# Patient Record
Sex: Female | Born: 1955 | Race: White | Hispanic: No | Marital: Single
Health system: Southern US, Community
[De-identification: ages and names within clinical notes are randomized; demographics above are authoritative.]

## PROBLEM LIST (undated history)

## (undated) DIAGNOSIS — G9340 Encephalopathy, unspecified: Secondary | ICD-10-CM

## (undated) DIAGNOSIS — I480 Paroxysmal atrial fibrillation: Secondary | ICD-10-CM

## (undated) DIAGNOSIS — E039 Hypothyroidism, unspecified: Secondary | ICD-10-CM

## (undated) DIAGNOSIS — J449 Chronic obstructive pulmonary disease, unspecified: Secondary | ICD-10-CM

## (undated) DIAGNOSIS — F329 Major depressive disorder, single episode, unspecified: Secondary | ICD-10-CM

## (undated) DIAGNOSIS — I10 Essential (primary) hypertension: Secondary | ICD-10-CM

## (undated) DIAGNOSIS — F32A Depression, unspecified: Secondary | ICD-10-CM

## (undated) DIAGNOSIS — N179 Acute kidney failure, unspecified: Secondary | ICD-10-CM

## (undated) DIAGNOSIS — I2510 Atherosclerotic heart disease of native coronary artery without angina pectoris: Secondary | ICD-10-CM

---

## 2015-06-19 ENCOUNTER — Inpatient Hospital Stay
Admission: AD | Admit: 2015-06-19 | Discharge: 2015-07-25 | Disposition: A | Discharge status: Skilled Nursing Facility | Payer: Self-pay | Source: Ambulatory Visit | Attending: Internal Medicine | Admitting: Internal Medicine

## 2015-06-19 ENCOUNTER — Institutional Professional Consult (permissible substitution) (HOSPITAL_COMMUNITY): Payer: Self-pay

## 2015-06-19 DIAGNOSIS — R110 Nausea: Secondary | ICD-10-CM

## 2015-06-19 DIAGNOSIS — J449 Chronic obstructive pulmonary disease, unspecified: Secondary | ICD-10-CM

## 2015-06-19 DIAGNOSIS — Z931 Gastrostomy status: Secondary | ICD-10-CM

## 2015-06-19 DIAGNOSIS — Z930 Tracheostomy status: Secondary | ICD-10-CM

## 2015-06-19 DIAGNOSIS — J189 Pneumonia, unspecified organism: Secondary | ICD-10-CM

## 2015-06-19 DIAGNOSIS — J9690 Respiratory failure, unspecified, unspecified whether with hypoxia or hypercapnia: Secondary | ICD-10-CM

## 2015-06-19 DIAGNOSIS — J9621 Acute and chronic respiratory failure with hypoxia: Secondary | ICD-10-CM

## 2015-06-19 DIAGNOSIS — Z452 Encounter for adjustment and management of vascular access device: Secondary | ICD-10-CM

## 2015-06-19 HISTORY — DX: Paroxysmal atrial fibrillation: I48.0

## 2015-06-19 HISTORY — DX: Depression, unspecified: F32.A

## 2015-06-19 HISTORY — DX: Atherosclerotic heart disease of native coronary artery without angina pectoris: I25.10

## 2015-06-19 HISTORY — DX: Encephalopathy, unspecified: G93.40

## 2015-06-19 HISTORY — DX: Hypothyroidism, unspecified: E03.9

## 2015-06-19 HISTORY — DX: Chronic obstructive pulmonary disease, unspecified: J44.9

## 2015-06-19 HISTORY — DX: Major depressive disorder, single episode, unspecified: F32.9

## 2015-06-19 HISTORY — DX: Acute kidney failure, unspecified: N17.9

## 2015-06-19 HISTORY — DX: Essential (primary) hypertension: I10

## 2015-06-20 ENCOUNTER — Encounter: Payer: Self-pay | Admitting: Adult Health

## 2015-06-20 ENCOUNTER — Other Ambulatory Visit (HOSPITAL_COMMUNITY): Payer: Self-pay

## 2015-06-20 DIAGNOSIS — J9611 Chronic respiratory failure with hypoxia: Secondary | ICD-10-CM

## 2015-06-20 DIAGNOSIS — J441 Chronic obstructive pulmonary disease with (acute) exacerbation: Secondary | ICD-10-CM

## 2015-06-20 DIAGNOSIS — Z930 Tracheostomy status: Secondary | ICD-10-CM

## 2015-06-20 DIAGNOSIS — J189 Pneumonia, unspecified organism: Secondary | ICD-10-CM

## 2015-06-20 LAB — COMPREHENSIVE METABOLIC PANEL
ALBUMIN: 2 g/dL — AB (ref 3.5–5.0)
ALT: 52 U/L (ref 14–54)
AST: 61 U/L — AB (ref 15–41)
Alkaline Phosphatase: 112 U/L (ref 38–126)
Anion gap: 9 (ref 5–15)
BUN: 70 mg/dL — AB (ref 6–20)
CHLORIDE: 97 mmol/L — AB (ref 101–111)
CO2: 28 mmol/L (ref 22–32)
CREATININE: 1.13 mg/dL — AB (ref 0.44–1.00)
Calcium: 8.3 mg/dL — ABNORMAL LOW (ref 8.9–10.3)
GFR calc Af Amer: >60 mL/min (ref >60)
GFR, EST NON AFRICAN AMERICAN: 52 mL/min — AB (ref >60)
GLUCOSE: 169 mg/dL — AB (ref 65–99)
POTASSIUM: 3.6 mmol/L (ref 3.5–5.1)
Sodium: 134 mmol/L — ABNORMAL LOW (ref 135–145)
Total Bilirubin: 0.8 mg/dL (ref 0.3–1.2)
Total Protein: 5.9 g/dL — ABNORMAL LOW (ref 6.5–8.1)

## 2015-06-20 LAB — CBC
HCT: 26.4 % — ABNORMAL LOW (ref 36.0–46.0)
Hemoglobin: 8 g/dL — ABNORMAL LOW (ref 12.0–15.0)
MCH: 28.3 pg (ref 26.0–34.0)
MCHC: 30.3 g/dL (ref 30.0–36.0)
MCV: 93.3 fL (ref 78.0–100.0)
PLATELETS: 213 10*3/uL (ref 150–400)
RBC: 2.83 MIL/uL — ABNORMAL LOW (ref 3.87–5.11)
RDW: 16 % — AB (ref 11.5–15.5)
WBC: 7.1 10*3/uL (ref 4.0–10.5)

## 2015-06-20 LAB — TSH: TSH: 2.873 u[IU]/mL (ref 0.350–4.500)

## 2015-06-20 NOTE — Consult Note (Signed)
[facility] CONSULT NOTE    Date: 06/20/2015                  Patient Name:  [name] Fowler  MRN: [id number]  DOB: 04/05/1954  Age / Sex: 59 y.o., female         PCP: No primary care provider on file.                 Service Requesting Consult: Dr. Sharyon MedicusHijazi                 Reason for Consult: Management of acute renal failure            History of Present Illness: Patient is a 59 y.o. female with a PMHx of COPD, hypertension, depression, hypothyroidism, paroxysmal atrial fibrillation, coronary artery disease, and recent acute renal failure, who was admitted to [facility] on 06/19/2015 for ongoing treatment of respiratory failure. She was originally admitted to an outside hospital with acute respiratory failure secondary to sepsis from right-sided pneumonia and acute exacerbation of COPD. She also developed atrial fibrillation with rapid ventricular response. She also had acute kidney injury requiring dialysis. She has a temporary doses catheter noted to be in place. She was transferred here on November 8 16 for further weaning efforts. She has a tracheostomy in place. She also had a PEG tube placed.   Medications:  Current medications: Allopurinol 200 mg daily, alprazolam 0.5 mg twice a day aspirin 81 mg daily, Lipitor 20 mg each bedtime, bupropion 75 mg twice a day, Plavix 75 mg daily, Pepcid 20 mg daily, ferrous sulfate 324 mg daily, fluconazole 100 mg daily, fluoxetine 40 mg daily, furosemide 40 mg twice a day, Lantus 18 units subcutaneous twice a day, Synthroid 50 mg daily, metoprolol 25 mg by mouth every 8 hours, Prostat 30 cc by mouth 3 times a day, vitamin B12 1000 mcg daily    Allergies: NSAIDs and Sulfa   Past Medical History: Past Medical History  Diagnosis Date  . COPD, severe (HCC)   . Hypertension   . Depression   . Hypothyroid   . Acute renal failure (ARF) (HCC)   . PAF (paroxysmal atrial fibrillation) (HCC)   . CAD (coronary artery  disease)   . Encephalopathy      Past Surgical History: S/p tracheostomy and PEG placement  Family History: Unable to obtain from the patient as she is currently on the ventilator  Social History: Unable to obtain from the patient as she is currently on a ventilator  Review of Systems: Unable to obtain from the patient as she is currently on the ventilator  Vital Signs: Temperature 99.3 pulse 72 respirations 17 blood pressure 128/62 Weight trends: There were no vitals filed for this visit.  Physical Exam: General: NAD, chronically ill appearing  Head: Normocephalic, atraumatic.  Eyes: Anicteric, EOMI  Nose: Mucous membranes moist, not inflammed, nonerythematous.  Throat: Oropharynx nonerythematous, no exudate appreciated.   Neck: Tracheostomy in place.  Lungs:  Scattered rhonchi, breath sounds ventilator assisted  Heart: S1-S2, no rubs.  Abdomen:  Soft, nontender, nondistended, PEG in place  Extremities: No pretibial edema.  Neurologic: Awake, alert, follows simple commands  Skin: No visible rashes, scars.    Lab results: Basic Metabolic Panel:  Recent Labs Lab 06/20/15 0605  NA 134*  K 3.6  CL 97*  CO2 28  GLUCOSE 169*  BUN 70*  CREATININE 1.13*  CALCIUM 8.3*    Liver Function Tests:  Recent Labs Lab  06/20/15 0605  AST 61*  ALT 52  ALKPHOS 112  BILITOT 0.8  PROT 5.9*  ALBUMIN 2.0*   No results for input(s): LIPASE, AMYLASE in the last 168 hours. No results for input(s): AMMONIA in the last 168 hours.  CBC:  Recent Labs Lab 06/20/15 0605  WBC 7.1  HGB 8.0*  HCT 26.4*  MCV 93.3  PLT 213    Cardiac Enzymes: No results for input(s): CKTOTAL, CKMB, CKMBINDEX, TROPONINI in the last 168 hours.  BNP: Invalid input(s): POCBNP  CBG: No results for input(s): GLUCAP in the last 168 hours.  Microbiology: No results found for this or any previous visit.  Coagulation Studies: No results for input(s): LABPROT, INR in the last 72  hours.  Urinalysis: No results for input(s): COLORURINE, LABSPEC, PHURINE, GLUCOSEU, HGBUR, BILIRUBINUR, KETONESUR, PROTEINUR, UROBILINOGEN, NITRITE, LEUKOCYTESUR in the last 72 hours.  Invalid input(s): APPERANCEUR    Imaging: Dg Chest Port 1 View  06/20/2015  CLINICAL DATA:  59 year old female with a history of respiratory failure. EXAM: PORTABLE CHEST 1 VIEW COMPARISON:  Abdominal plain film 06/19/2015 FINDINGS: Patient has left rotated. Compared to the plain film of the abdomen including the lower lung fields of the previous day, there is increasing opacity at the left base, with obscuration of left hemidiaphragm an the left heart border. Increasing opacity at the right base obscuring the costophrenic angle. No pneumothorax. Right IJ approach hemodialysis catheter. Tip appears to terminate at the superior vena cava right atrial junction. Tracheostomy tube in position. Left upper extremity PICC, appears to terminate in the brachycephalic vein, however, the tip projects over the aortic arch. Calcifications of the aortic arch. No displaced fracture. IMPRESSION: Compared to the abdominal plain film of the previous day, there is increasing opacity at the bilateral bases, likely a combination of pleural effusion, edema, and/ or consolidation. Correlation with a history of aspiration recommended, as well as fluid overload. Left upper extremity PICC appears to terminate in the region of the brachiocephalic vein, however, this cannot be confirmed on this image and arterial location could have this appearance. If there is any question of the location, correlation with blood gas analysis would be useful. Right IJ approach hemodialysis catheter. Tracheostomy. Atherosclerosis. Signed, Yvone Neu. Loreta Ave, DO [facility] Electronically Signed   By: Gilmer Mor D.O.   On: 06/20/2015 07:27   Dg Abd Portable 1v  06/19/2015  CLINICAL DATA:  Percutaneous  gastrostomy tube placement. EXAM: PORTABLE ABDOMEN - 1 VIEW COMPARISON:  None. FINDINGS: Exam demonstrates evidence of patient's percutaneous gastrostomy tube with tip overlying the mid abdomen just left of midline likely over the stomach. Bowel gas pattern is nonobstructive. There are mild degenerate changes of the spine. IMPRESSION: Nonobstructive bowel gas pattern. Percutaneous gastrostomy tube with tip overlying the mid abdomen just left of midline likely over the stomach. Electronically Signed   By: Elberta Fortis M.D.   On: 06/19/2015 18:58      Assessment & Plan: Pt is a 59 y.o. female  with a PMHx of COPD, hypertension, depression, hypothyroidism, paroxysmal atrial fibrillation, coronary artery disease, and recent acute renal failure, who was admitted to [facility] on 06/19/2015 for ongoing treatment of respiratory failure.   1.  Acute renal failure. 2.  Acute respiratory failure. 3.  Gout.   Plan: The patient was found to have acute renal failure at the outside hospital but ended up requiring dialysis treatment. Her BUN is only 70 with a creatinine of 1.1. We  will follow BUN/creatinine trend before restarting her on dialysis. We will monitor urine output closely as well. She may have little muscle mass and may not be able to generate a very high creatinine. Continue ventilatory support for now as well as weaning efforts. In regards to her gout we would recommend reducing allopurinol to 100 mg by mouth daily given her dialysis status at present. Thank you for consultation. Further plan as patient progresses.

## 2015-06-20 NOTE — Consult Note (Signed)
Name: [name] Fowler MRN: [id number] DOB: 04/05/1954    ADMISSION DATE:  06/19/2015 CONSULTATION DATE:  11/9  REFERRING MD :  Hijazi  CHIEF COMPLAINT:  Vent management   BRIEF PATIENT DESCRIPTION: 58yo female with hx depression, HTN, DM, severe COPD originally admitted to outside hospital 05/7015 with acute on chronic respiratory failure and sepsis r/t PNA and AECOPD.  Her course was c/b AFib with RVR, acute on chronic systolic heart failure, encephalopathy and AKI requiring dialysis.  She required intubation on admit and failed multiple attempts at weaning and ultimately had trach and PEG placed.  She was tx 11/8 to [facility] for further weaning efforts.   SIGNIFICANT EVENTS  Trach (OSH) 10/18>> PEG (OSH) 10/19>>>  STUDIES:    HISTORY OF PRESENT ILLNESS:  59yo female with hx depression, HTN, DM, severe COPD originally admitted to outside hospital 05/7015 with acute on chronic respiratory failure and sepsis r/t PNA and AECOPD.  Her course was c/b AFib with RVR, acute on chronic systolic heart failure, encephalopathy and AKI requiring dialysis.  She required intubation on admit and failed multiple attempts at weaning and ultimately had trach and PEG placed.  She was tx 11/8 to [facility] for further weaning efforts.  Currently tolerating PS wean per [facility] protocol and nearing ready for ATC trials.   Denies dyspnea, chest pain, purulent sputum.    PAST MEDICAL HISTORY :   has a past medical history of COPD, severe (HCC); Hypertension; Depression; Hypothyroid; Acute renal failure (ARF) (HCC); PAF (paroxysmal atrial fibrillation) (HCC); CAD (coronary artery disease); and Encephalopathy.  has no past surgical history on file. Prior to Admission medications   Not on File   Allergies not on file  FAMILY HISTORY:  family history is not on file. SOCIAL HISTORY:    REVIEW OF SYSTEMS:   As per HPI - All other systems reviewed and were neg.    SUBJECTIVE:   VITAL SIGNS:   Reviewed  at bedside - abnormal values discussed in Imp/Plan.   PHYSICAL EXAMINATION: General:  Chronically ill appearing female, NAD on vent  Neuro:  Awake, alert, nods appropriately, slow to respond at times  HEENT:  Mm moist, no JVD  Cardiovascular:  s1s2 irreg  Lungs:  resps even non labored on full vent support, few exp wheeze, few bibasilar crackles  Abdomen:  Round, soft, +bs  Musculoskeletal:  Foot drop, no sig edema    Recent Labs Lab 06/20/15 0605  NA 134*  K 3.6  CL 97*  CO2 28  BUN 70*  CREATININE 1.13*  GLUCOSE 169*    Recent Labs Lab 06/20/15 0605  HGB 8.0*  HCT 26.4*  WBC 7.1  PLT 213   Dg Chest Port 1 View  06/20/2015  CLINICAL DATA:  59 year old female with a history of respiratory failure. EXAM: PORTABLE CHEST 1 VIEW COMPARISON:  Abdominal plain film 06/19/2015 FINDINGS: Patient has left rotated. Compared to the plain film of the abdomen including the lower lung fields of the previous day, there is increasing opacity at the left base, with obscuration of left hemidiaphragm an the left heart border. Increasing opacity at the right base obscuring the costophrenic angle. No pneumothorax. Right IJ approach hemodialysis catheter. Tip appears to terminate at the superior vena cava right atrial junction. Tracheostomy tube in position. Left upper extremity PICC, appears to terminate in the brachycephalic vein, however, the tip projects over the aortic arch. Calcifications of the aortic arch. No displaced fracture. IMPRESSION: Compared to the abdominal plain film  of the previous day, there is increasing opacity at the bilateral bases, likely a combination of pleural effusion, edema, and/ or consolidation. Correlation with a history of aspiration recommended, as well as fluid overload. Left upper extremity PICC appears to terminate in the region of the brachiocephalic vein, however, this cannot be confirmed on this image and arterial location could have this appearance. If there is any  question of the location, correlation with blood gas analysis would be useful. Right IJ approach hemodialysis catheter. Tracheostomy. Atherosclerosis. Signed, Yvone NeuJaime S. Loreta AveWagner, DO [facility] Electronically Signed   By: Gilmer MorJaime  Wagner D.O.   On: 06/20/2015 07:27   Dg Abd Portable 1v  06/19/2015  CLINICAL DATA:  Percutaneous gastrostomy tube placement. EXAM: PORTABLE ABDOMEN - 1 VIEW COMPARISON:  None. FINDINGS: Exam demonstrates evidence of patient's percutaneous gastrostomy tube with tip overlying the mid abdomen just left of midline likely over the stomach. Bowel gas pattern is nonobstructive. There are mild degenerate changes of the spine. IMPRESSION: Nonobstructive bowel gas pattern. Percutaneous gastrostomy tube with tip overlying the mid abdomen just left of midline likely over the stomach. Electronically Signed   By: Elberta Fortisaniel  Boyle M.D.   On: 06/19/2015 18:58    ASSESSMENT / PLAN:  Acute on chronic respiratory failure - s/p trach.  AECOPD  CAP  Acute on chronic systolic heart failure  PAF   PLAN -  PS wean per [facility]  Quick transition to ATC as tol  BD's  Abx per primary  F/u CXR  PT/OT - rec mobilize on vent  No need IV steroids at this time  Keep dry as tol  HD per renal  HR control    Dirk DressKaty Whiteheart, NP 06/20/2015  3:57 PM Pager: [phone] or [phone]  Attending Note:  59 year old female with COPD [facility] for PNA and AE-COPD complicated by a-fib and renal failure requiring dialysis.  On exam diffuse crackles.  I reviewed CXR myself, trach in good position.  Discussed with SSH-MD and PCCM-NP.  Acute on chronic respiratory failure - s/p trach.   - Continue weaning efforts.  - Lasix for diureses.  - Titrate O2 for sats.  AECOPD   - Steroids as ordered.  - Abx as ordered.  - Titrate O2 for sat of 88-92%.  CAP   - Abx as ordered.  - PCT.  - F/U  on culture.  - HCAP treatment.  Acute on chronic systolic heart failure   - Diureses as able.  - 2D echo.  PAF   - Diureses.  - Cardizem for rate control.  Patient seen and examined, agree with above note.  I dictated the care and orders written for this patient under my direction.  Alyson ReedyWesam G Yacoub, MD [phone]

## 2015-06-21 DIAGNOSIS — J449 Chronic obstructive pulmonary disease, unspecified: Secondary | ICD-10-CM

## 2015-06-21 DIAGNOSIS — J189 Pneumonia, unspecified organism: Secondary | ICD-10-CM

## 2015-06-21 DIAGNOSIS — Z930 Tracheostomy status: Secondary | ICD-10-CM

## 2015-06-21 DIAGNOSIS — J9621 Acute and chronic respiratory failure with hypoxia: Secondary | ICD-10-CM

## 2015-06-22 NOTE — Progress Notes (Signed)
  [facility]  ROUNDING NOTE   Subjective:  Patient seen at bedside. Appears to be awake and alert and is following commands. Still has tracheostomy in place and remains on the ventilator. FiO2 is currently 28%.   Objective:  Vital signs in last 24 hours:   temperature 97.8 pulse 70 respirations 21 blood pressure 136/68 urine output 1000 cc over the past 24 hours      Physical Exam: General: NAD, on the vent  Head: Normocephalic, atraumatic. Moist oral mucosal membranes  Eyes: Anicteric  Neck: Tracheostomy in place  Lungs:  Scattered rhonchi, vent assisted fio2 28%  Heart: S1S2 no rubs  Abdomen:  Soft, nontender, BS present, PEG  Extremities:  no peripheral edema.  Neurologic: Nonfocal, moving all four extremities  Skin: No lesions  Access: R IJ dialysis catheter    Basic Metabolic Panel:  Recent Labs Lab 06/20/15 0605  NA 134*  K 3.6  CL 97*  CO2 28  GLUCOSE 169*  BUN 70*  CREATININE 1.13*  CALCIUM 8.3*    Liver Function Tests:  Recent Labs Lab 06/20/15 0605  AST 61*  ALT 52  ALKPHOS 112  BILITOT 0.8  PROT 5.9*  ALBUMIN 2.0*   No results for input(s): LIPASE, AMYLASE in the last 168 hours. No results for input(s): AMMONIA in the last 168 hours.  CBC:  Recent Labs Lab 06/20/15 0605  WBC 7.1  HGB 8.0*  HCT 26.4*  MCV 93.3  PLT 213    Cardiac Enzymes: No results for input(s): CKTOTAL, CKMB, CKMBINDEX, TROPONINI in the last 168 hours.  BNP: Invalid input(s): POCBNP  CBG: No results for input(s): GLUCAP in the last 168 hours.  Microbiology: No results found for this or any previous visit.  Coagulation Studies: No results for input(s): LABPROT, INR in the last 72 hours.  Urinalysis: No results for input(s): COLORURINE, LABSPEC, PHURINE, GLUCOSEU, HGBUR, BILIRUBINUR, KETONESUR, PROTEINUR, UROBILINOGEN, NITRITE, LEUKOCYTESUR in the last 72 hours.  Invalid input(s): APPERANCEUR    Imaging: No results  found.   Medications:       Assessment/ Plan:  59 y.o. female with a PMHx of COPD, hypertension, depression, hypothyroidism, paroxysmal atrial fibrillation, coronary artery disease, and recent acute renal failure, who was admitted to [facility] on 06/19/2015 for ongoing treatment of respiratory failure.   1. Acute renal failure. 2. Acute respiratory failure. 3. Gout.  4.  Anemia unspecified.  Plan:   No new creatinine to review today. We will therefore order a renal function panel. Her urine output over the past 24 hours however was found to be 1 L. Therefore no urgent indication to restart dialysis at this time. She does have a dialysis catheter in place from her prior hospitalization. Patient is currently awake and alert and hopefully can be weaned from the ventilator in the relatively near future. We have placed him on the order to decrease allopurinol to 100 mg by mouth daily given low renal function. We plan to reevaluate the patient on Monday.   LOS:  Yaiza Palazzola 11/11/20168:56 AM

## 2015-06-23 LAB — CBC
HCT: 28.7 % — ABNORMAL LOW (ref 36.0–46.0)
Hemoglobin: 8.7 g/dL — ABNORMAL LOW (ref 12.0–15.0)
MCH: 28.2 pg (ref 26.0–34.0)
MCHC: 30.3 g/dL (ref 30.0–36.0)
MCV: 93.2 fL (ref 78.0–100.0)
PLATELETS: 224 10*3/uL (ref 150–400)
RBC: 3.08 MIL/uL — ABNORMAL LOW (ref 3.87–5.11)
RDW: 16.2 % — AB (ref 11.5–15.5)
WBC: 7 10*3/uL (ref 4.0–10.5)

## 2015-06-23 LAB — BASIC METABOLIC PANEL
Anion gap: 12 (ref 5–15)
BUN: 77 mg/dL — AB (ref 6–20)
CALCIUM: 9.1 mg/dL (ref 8.9–10.3)
CO2: 27 mmol/L (ref 22–32)
CREATININE: 1.12 mg/dL — AB (ref 0.44–1.00)
Chloride: 96 mmol/L — ABNORMAL LOW (ref 101–111)
GFR calc Af Amer: >60 mL/min (ref >60)
GFR, EST NON AFRICAN AMERICAN: 53 mL/min — AB (ref >60)
GLUCOSE: 165 mg/dL — AB (ref 65–99)
Potassium: 3.7 mmol/L (ref 3.5–5.1)
SODIUM: 135 mmol/L (ref 135–145)

## 2015-06-24 LAB — CBC
HCT: 24.1 % — ABNORMAL LOW (ref 36.0–46.0)
Hemoglobin: 7.5 g/dL — ABNORMAL LOW (ref 12.0–15.0)
MCH: 29.1 pg (ref 26.0–34.0)
MCHC: 31.1 g/dL (ref 30.0–36.0)
MCV: 93.4 fL (ref 78.0–100.0)
PLATELETS: 200 10*3/uL (ref 150–400)
RBC: 2.58 MIL/uL — ABNORMAL LOW (ref 3.87–5.11)
RDW: 16.1 % — AB (ref 11.5–15.5)
WBC: 6.8 10*3/uL (ref 4.0–10.5)

## 2015-06-24 LAB — RENAL FUNCTION PANEL
ALBUMIN: 2.2 g/dL — AB (ref 3.5–5.0)
Anion gap: 9 (ref 5–15)
BUN: 77 mg/dL — AB (ref 6–20)
CALCIUM: 8.6 mg/dL — AB (ref 8.9–10.3)
CO2: 30 mmol/L (ref 22–32)
Chloride: 96 mmol/L — ABNORMAL LOW (ref 101–111)
Creatinine, Ser: 1.11 mg/dL — ABNORMAL HIGH (ref 0.44–1.00)
GFR calc Af Amer: >60 mL/min (ref >60)
GFR calc non Af Amer: 54 mL/min — ABNORMAL LOW (ref >60)
GLUCOSE: 155 mg/dL — AB (ref 65–99)
PHOSPHORUS: 4.8 mg/dL — AB (ref 2.5–4.6)
Potassium: 3.3 mmol/L — ABNORMAL LOW (ref 3.5–5.1)
SODIUM: 135 mmol/L (ref 135–145)

## 2015-06-24 LAB — MAGNESIUM: Magnesium: 2.2 mg/dL (ref 1.7–2.4)

## 2015-06-24 LAB — BRAIN NATRIURETIC PEPTIDE: B NATRIURETIC PEPTIDE 5: 2407.3 pg/mL — AB (ref 0.0–100.0)

## 2015-06-25 DIAGNOSIS — J9621 Acute and chronic respiratory failure with hypoxia: Secondary | ICD-10-CM

## 2015-06-25 DIAGNOSIS — J449 Chronic obstructive pulmonary disease, unspecified: Secondary | ICD-10-CM

## 2015-06-25 LAB — RENAL FUNCTION PANEL
ANION GAP: 8 (ref 5–15)
Albumin: 2.3 g/dL — ABNORMAL LOW (ref 3.5–5.0)
BUN: 82 mg/dL — ABNORMAL HIGH (ref 6–20)
CHLORIDE: 97 mmol/L — AB (ref 101–111)
CO2: 28 mmol/L (ref 22–32)
Calcium: 8.7 mg/dL — ABNORMAL LOW (ref 8.9–10.3)
Creatinine, Ser: 1.16 mg/dL — ABNORMAL HIGH (ref 0.44–1.00)
GFR, EST AFRICAN AMERICAN: 59 mL/min — AB (ref >60)
GFR, EST NON AFRICAN AMERICAN: 51 mL/min — AB (ref >60)
Glucose, Bld: 172 mg/dL — ABNORMAL HIGH (ref 65–99)
POTASSIUM: 4 mmol/L (ref 3.5–5.1)
Phosphorus: 4.8 mg/dL — ABNORMAL HIGH (ref 2.5–4.6)
Sodium: 133 mmol/L — ABNORMAL LOW (ref 135–145)

## 2015-06-25 NOTE — Progress Notes (Signed)
   Name: [name] Fowler MRN: [id number] DOB: Jan 15, 1956    ADMISSION DATE:  06/19/2015 CONSULTATION DATE:  11/9  REFERRING MD :  Hijazi  CHIEF COMPLAINT:  Vent management   BRIEF PATIENT DESCRIPTION: 59yo female with hx depression, HTN, DM, severe COPD originally admitted to outside hospital 05/7015 with acute on chronic respiratory failure and sepsis r/t PNA and AECOPD.  Her course was c/b AFib with RVR, acute on chronic systolic heart failure, encephalopathy and AKI requiring dialysis.  She required intubation on admit and failed multiple attempts at weaning and ultimately had trach and PEG placed.  She was tx 11/8 to [facility] for further weaning efforts.   SIGNIFICANT EVENTS  Trach (OSH) 10/18>> PEG (OSH) 10/19>>>  STUDIES:    SUBJECTIVE: Did 1-2 hours of ATC  over  Weekend This a.m., developed anxiety and hemoptysis   VITAL SIGNS:   Reviewed at bedside - abnormal values discussed in Imp/Plan.   PHYSICAL EXAMINATION: General:  Chronically ill appearing female, NAD on vent  Neuro:  Awake, alert, nods appropriately, slow to respond at times  HEENT:  Mm moist, no JVD  Cardiovascular:  s1s2 irreg  Lungs:  resps even non labored on full vent support, few exp wheeze, few bibasilar crackles  Abdomen:  Round, soft, +bs  Musculoskeletal:  Foot drop, no sig edema    Recent Labs Lab 06/23/15 1345 06/24/15 0723 06/25/15 0731  NA 135 135 133*  K 3.7 3.3* 4.0  CL 96* 96* 97*  CO2 27 30 28   BUN 77* 77* 82*  CREATININE 1.12* 1.11* 1.16*  GLUCOSE 165* 155* 172*    Recent Labs Lab 06/20/15 0605 06/23/15 1345 06/24/15 0723  HGB 8.0* 8.7* 7.5*  HCT 26.4* 28.7* 24.1*  WBC 7.1 7.0 6.8  PLT 213 224 200   No results found.  ASSESSMENT / PLAN:  Acute on chronic respiratory failure - s/p trach.  AECOPD  CAP    PLAN -  PS wean per [facility] -If tolerates then Quick transition to ATC as tol  Continue steroids and BD's  Abx per primary  PT/OT - rec mobilize on  vent   Acute on chronic systolic heart failure  PAF  AKI Keep dry as tol  HD per renal  HR control    06/25/2015  11:48 AM Oretha Milchakesh Alva V, MD  [phone]

## 2015-06-25 NOTE — Progress Notes (Signed)
Error/duplicate  Simonne MartinetPeter E Sia Gabrielsen, NP

## 2015-06-25 NOTE — Progress Notes (Signed)
  [facility]  ROUNDING NOTE   Subjective:  Patient seen at bedside. alert and is following commands. Still has tracheostomy in place and remains on the ventilator. FiO2 is currently 35%/PEEP 5. uop 1800 cc   Objective:  Vital signs in last 24 hours:   temperature 97.9 pulse 72 respirations 22 blood pressure 122/68        Physical Exam: General: NAD, on the vent  Head: Normocephalic, atraumatic. Moist oral mucosal membranes  Eyes: Anicteric  Neck: Tracheostomy in place  Lungs:  Scattered rhonchi, vent assisted   Heart: S1S2 no rubs  Abdomen:  Soft, nontender, BS present, PEG  Extremities:  no peripheral edema.  Neurologic: Nonfocal, moving all four extremities  Skin: No acute lesions  Access: R IJ dialysis catheter    Basic Metabolic Panel:  Recent Labs Lab 06/20/15 0605 06/23/15 1345 06/24/15 0723 06/25/15 0731  NA 134* 135 135 133*  K 3.6 3.7 3.3* 4.0  CL 97* 96* 96* 97*  CO2 28 27 30 28   GLUCOSE 169* 165* 155* 172*  BUN 70* 77* 77* 82*  CREATININE 1.13* 1.12* 1.11* 1.16*  CALCIUM 8.3* 9.1 8.6* 8.7*  MG  --   --  2.2  --   PHOS  --   --  4.8* 4.8*    Liver Function Tests:  Recent Labs Lab 06/20/15 0605 06/24/15 0723 06/25/15 0731  AST 61*  --   --   ALT 52  --   --   ALKPHOS 112  --   --   BILITOT 0.8  --   --   PROT 5.9*  --   --   ALBUMIN 2.0* 2.2* 2.3*   No results for input(s): LIPASE, AMYLASE in the last 168 hours. No results for input(s): AMMONIA in the last 168 hours.  CBC:  Recent Labs Lab 06/20/15 0605 06/23/15 1345 06/24/15 0723  WBC 7.1 7.0 6.8  HGB 8.0* 8.7* 7.5*  HCT 26.4* 28.7* 24.1*  MCV 93.3 93.2 93.4  PLT 213 224 200    Cardiac Enzymes: No results for input(s): CKTOTAL, CKMB, CKMBINDEX, TROPONINI in the last 168 hours.  BNP: Invalid input(s): POCBNP  CBG: No results for input(s): GLUCAP in the last 168 hours.  Microbiology: No results found for this or any previous visit.  Coagulation  Studies: No results for input(s): LABPROT, INR in the last 72 hours.  Urinalysis: No results for input(s): COLORURINE, LABSPEC, PHURINE, GLUCOSEU, HGBUR, BILIRUBINUR, KETONESUR, PROTEINUR, UROBILINOGEN, NITRITE, LEUKOCYTESUR in the last 72 hours.  Invalid input(s): APPERANCEUR    Imaging: No results found.   Medications:       Assessment/ Plan:  59 y.o. female with a PMHx of COPD, hypertension, depression, hypothyroidism, paroxysmal atrial fibrillation, coronary artery disease, and recent acute renal failure, who was admitted to [facility] on 06/19/2015 for ongoing treatment of respiratory failure.   1. Acute renal failure. 2. Acute respiratory failure. 3. Gout.  4.  Anemia unspecified.  Plan:   UOP 1800 cc last 24 hrs S Cr and electrolytes are acceptable No acute indication for HD at present    LOS:  [name] Fowler 11/14/20164:12 PM

## 2015-06-27 LAB — HEMOGLOBIN AND HEMATOCRIT, BLOOD
HCT: 24.3 % — ABNORMAL LOW (ref 36.0–46.0)
HEMOGLOBIN: 7.7 g/dL — AB (ref 12.0–15.0)

## 2015-06-27 NOTE — Progress Notes (Signed)
  [facility]  ROUNDING NOTE   Subjective:  Patient seen at bedside. alert and is following commands. Sitting up at the edge of the bed Still has tracheostomy in place and remains on the ventilator. FiO2 is currently 35%/PEEP 5. uop 1700 cc   Objective:  Vital signs in last 24 hours:   temperature 98.9 pulse 72 respirations 24 blood pressure 110/52        Physical Exam: General: NAD, on the vent  Head: Normocephalic, atraumatic. Moist oral mucosal membranes  Eyes: Anicteric  Neck: Tracheostomy in place  Lungs:  Scattered rhonchi, vent assisted   Heart: S1S2 no rubs  Abdomen:  Soft, nontender, BS present, PEG  Extremities:  no peripheral edema.  Neurologic: Nonfocal, moving all four extremities  Skin: No acute lesions  Access: R IJ dialysis catheter    Basic Metabolic Panel:  Recent Labs Lab 06/23/15 1345 06/24/15 0723 06/25/15 0731  NA 135 135 133*  K 3.7 3.3* 4.0  CL 96* 96* 97*  CO2 27 30 28   GLUCOSE 165* 155* 172*  BUN 77* 77* 82*  CREATININE 1.12* 1.11* 1.16*  CALCIUM 9.1 8.6* 8.7*  MG  --  2.2  --   PHOS  --  4.8* 4.8*    Liver Function Tests:  Recent Labs Lab 06/24/15 0723 06/25/15 0731  ALBUMIN 2.2* 2.3*   No results for input(s): LIPASE, AMYLASE in the last 168 hours. No results for input(s): AMMONIA in the last 168 hours.  CBC:  Recent Labs Lab 06/23/15 1345 06/24/15 0723 06/27/15 0642  WBC 7.0 6.8  --   HGB 8.7* 7.5* 7.7*  HCT 28.7* 24.1* 24.3*  MCV 93.2 93.4  --   PLT 224 200  --     Cardiac Enzymes: No results for input(s): CKTOTAL, CKMB, CKMBINDEX, TROPONINI in the last 168 hours.  BNP: Invalid input(s): POCBNP  CBG: No results for input(s): GLUCAP in the last 168 hours.  Microbiology: No results found for this or any previous visit.  Coagulation Studies: No results for input(s): LABPROT, INR in the last 72 hours.  Urinalysis: No results for input(s): COLORURINE, LABSPEC, PHURINE, GLUCOSEU, HGBUR,  BILIRUBINUR, KETONESUR, PROTEINUR, UROBILINOGEN, NITRITE, LEUKOCYTESUR in the last 72 hours.  Invalid input(s): APPERANCEUR    Imaging: No results found.   Medications:       Assessment/ Plan:  59 y.o. female with a PMHx of COPD, hypertension, depression, hypothyroidism, paroxysmal atrial fibrillation, coronary artery disease, and recent acute renal failure, who was admitted to [facility] on 06/19/2015 for ongoing treatment of respiratory failure.   1. Acute renal failure. 2. Acute respiratory failure. 3. Gout.  4.  Anemia unspecified.  Plan:   UOP 1700 cc last 24 hrs No new labs today No acute indication for HD at present Will order labs for friday    LOS:  Marquarius Lofton 11/16/20163:35 PM

## 2015-06-27 NOTE — Progress Notes (Signed)
   Name: [name] Fowler MRN: [id number] DOB: 1955/08/21    ADMISSION DATE:  06/19/2015 CONSULTATION DATE:  11/9  REFERRING MD :  Hijazi  CHIEF COMPLAINT:  Vent management   BRIEF PATIENT DESCRIPTION: 59yo female with hx depression, HTN, DM, severe COPD originally admitted to outside hospital 05/7015 with acute on chronic respiratory failure and sepsis r/t PNA and AECOPD.  Her course was c/b AFib with RVR, acute on chronic systolic heart failure, encephalopathy and AKI requiring dialysis.  She required intubation on admit and failed multiple attempts at weaning and ultimately had trach and PEG placed.  She was tx 11/8 to [facility] for further weaning efforts.   SIGNIFICANT EVENTS  Trach (OSH) 10/18>> PEG (OSH) 10/19>>>  STUDIES:    SUBJECTIVE: On full vent support   VITAL SIGNS:   Reviewed at bedside - abnormal values discussed in Imp/Plan.   PHYSICAL EXAMINATION: General:  Chronically ill appearing female, NAD on vent  Neuro:  Awake, alert, nods appropriately, slow to respond at times  HEENT:  Mm moist, no JVD  Cardiovascular:  s1s2 irreg  Lungs:  resps even non labored on full vent support,  few bibasilar crackles  Abdomen:  Round, soft, +bs  Musculoskeletal:  Foot drop, no sig edema    Recent Labs Lab 06/23/15 1345 06/24/15 0723 06/25/15 0731  NA 135 135 133*  K 3.7 3.3* 4.0  CL 96* 96* 97*  CO2 27 30 28   BUN 77* 77* 82*  CREATININE 1.12* 1.11* 1.16*  GLUCOSE 165* 155* 172*    Recent Labs Lab 06/23/15 1345 06/24/15 0723 06/27/15 0642  HGB 8.7* 7.5* 7.7*  HCT 28.7* 24.1* 24.3*  WBC 7.0 6.8  --   PLT 224 200  --    No results found.  ASSESSMENT / PLAN:  Acute on chronic respiratory failure - s/p trach.  AECOPD  CAP    PLAN -  PS wean [facility] -If tolerates then Quick transition to ATC as tol  Continue steroids and BD's  Abx per primary  PT/OT - rec mobilize on vent   Acute on chronic systolic heart failure  PAF  AKI Keep dry as  tol  HD per renal  HR control    Brett CanalesSteve Minor ACNP [facility] Pager [phone] till 3 pm If no answer page [phone] 06/27/2015, 11:03 AM

## 2015-06-28 LAB — URINALYSIS, ROUTINE W REFLEX MICROSCOPIC
BILIRUBIN URINE: NEGATIVE
Glucose, UA: NEGATIVE mg/dL
KETONES UR: NEGATIVE mg/dL
NITRITE: POSITIVE — AB
Protein, ur: 30 mg/dL — AB
SPECIFIC GRAVITY, URINE: 1.015 (ref 1.005–1.030)
pH: 5 (ref 5.0–8.0)

## 2015-06-28 LAB — URINE MICROSCOPIC-ADD ON

## 2015-06-29 ENCOUNTER — Other Ambulatory Visit (HOSPITAL_COMMUNITY): Payer: Self-pay

## 2015-06-29 LAB — RENAL FUNCTION PANEL
Albumin: 1.9 g/dL — ABNORMAL LOW (ref 3.5–5.0)
Anion gap: 9 (ref 5–15)
BUN: 109 mg/dL — AB (ref 6–20)
CHLORIDE: 94 mmol/L — AB (ref 101–111)
CO2: 27 mmol/L (ref 22–32)
CREATININE: 1.51 mg/dL — AB (ref 0.44–1.00)
Calcium: 8.7 mg/dL — ABNORMAL LOW (ref 8.9–10.3)
GFR calc Af Amer: 43 mL/min — ABNORMAL LOW (ref >60)
GFR, EST NON AFRICAN AMERICAN: 37 mL/min — AB (ref >60)
GLUCOSE: 209 mg/dL — AB (ref 65–99)
POTASSIUM: 5 mmol/L (ref 3.5–5.1)
Phosphorus: 4.7 mg/dL — ABNORMAL HIGH (ref 2.5–4.6)
Sodium: 130 mmol/L — ABNORMAL LOW (ref 135–145)

## 2015-06-29 NOTE — Progress Notes (Signed)
[facility]  ROUNDING NOTE   Subjective:  Patient is alert and is following commands.   Still has tracheostomy in place and remains on the ventilator. FiO2 is currently 28%/PEEP 5. uop 1750 cc   Objective:  Vital signs in last 24 hours:   temperature 98.9 pulse 86 respirations 30 blood pressure 129/73        Physical Exam: General: NAD, on the vent  Head: Normocephalic, atraumatic. Moist oral mucosal membranes  Eyes: Anicteric  Neck: Tracheostomy in place  Lungs:  Scattered rhonchi, vent assisted   Heart: S1S2 no rubs  Abdomen:  Soft, nontender, BS present, PEG  Extremities:  no peripheral edema.  Neurologic: Nonfocal, moving all four extremities  Skin: No acute lesions  Access: R IJ dialysis catheter    Basic Metabolic Panel:  Recent Labs Lab 06/23/15 1345 06/24/15 0723 06/25/15 0731 06/29/15 0630  NA 135 135 133* 130*  K 3.7 3.3* 4.0 5.0  CL 96* 96* 97* 94*  CO2 27 30 28 27   GLUCOSE 165* 155* 172* 209*  BUN 77* 77* 82* 109*  CREATININE 1.12* 1.11* 1.16* 1.51*  CALCIUM 9.1 8.6* 8.7* 8.7*  MG  --  2.2  --   --   PHOS  --  4.8* 4.8* 4.7*    Liver Function Tests:  Recent Labs Lab 06/24/15 0723 06/25/15 0731 06/29/15 0630  ALBUMIN 2.2* 2.3* 1.9*   No results for input(s): LIPASE, AMYLASE in the last 168 hours. No results for input(s): AMMONIA in the last 168 hours.  CBC:  Recent Labs Lab 06/23/15 1345 06/24/15 0723 06/27/15 0642  WBC 7.0 6.8  --   HGB 8.7* 7.5* 7.7*  HCT 28.7* 24.1* 24.3*  MCV 93.2 93.4  --   PLT 224 200  --     Cardiac Enzymes: No results for input(s): CKTOTAL, CKMB, CKMBINDEX, TROPONINI in the last 168 hours.  BNP: Invalid input(s): POCBNP  CBG: No results for input(s): GLUCAP in the last 168 hours.  Microbiology: No results found for this or any previous visit.  Coagulation Studies: No results for input(s): LABPROT, INR in the last 72 hours.  Urinalysis:  Recent Labs  06/28/15 2215   COLORURINE AMBER*  LABSPEC 1.015  PHURINE 5.0  GLUCOSEU NEGATIVE  HGBUR SMALL*  BILIRUBINUR NEGATIVE  KETONESUR NEGATIVE  PROTEINUR 30*  NITRITE POSITIVE*  LEUKOCYTESUR LARGE*      Imaging: Dg Chest Port 1 View  06/29/2015  CLINICAL DATA:  Respiratory failure. EXAM: PORTABLE CHEST 1 VIEW COMPARISON:  Single view of the chest 06/20/2015. FINDINGS: Tracheostomy tube, left PICC and right IJ approach dialysis catheter all remain in place. Bilateral pleural effusions have markedly increased since the prior examination. There is cardiomegaly and pulmonary edema. Basilar atelectasis is noted. IMPRESSION: Marked worsening of pulmonary edema and bilateral pleural effusions. Electronically Signed   By: Drusilla Kannerhomas  Dalessio M.D.   On: 06/29/2015 07:29     Medications:       Assessment/ Plan:  59 y.o. female with a PMHx of COPD, hypertension, depression, hypothyroidism, paroxysmal atrial fibrillation, coronary artery disease, and recent acute renal failure, who was admitted to [facility] on 06/19/2015 for ongoing treatment of respiratory failure.   1. Acute renal failure. 2. Acute respiratory failure. 3. Gout.  4.  Anemia unspecified.  Plan:   UOP 1750 cc last 24 hrs BUN/Cr have increased. BUN is disproportionately high. Consider hemoccult No acute indication for HD at present Will continue to monitor    LOS:  [facility],Maliyah Willets 11/18/20169:13  AM    

## 2015-06-30 LAB — BASIC METABOLIC PANEL
ANION GAP: 10 (ref 5–15)
BUN: 107 mg/dL — ABNORMAL HIGH (ref 6–20)
CHLORIDE: 96 mmol/L — AB (ref 101–111)
CO2: 27 mmol/L (ref 22–32)
Calcium: 8.9 mg/dL (ref 8.9–10.3)
Creatinine, Ser: 1.56 mg/dL — ABNORMAL HIGH (ref 0.44–1.00)
GFR calc Af Amer: 42 mL/min — ABNORMAL LOW (ref >60)
GFR, EST NON AFRICAN AMERICAN: 36 mL/min — AB (ref >60)
GLUCOSE: 153 mg/dL — AB (ref 65–99)
POTASSIUM: 5.3 mmol/L — AB (ref 3.5–5.1)
Sodium: 133 mmol/L — ABNORMAL LOW (ref 135–145)

## 2015-07-01 LAB — URINE CULTURE: Culture: >=100000

## 2015-07-02 NOTE — Progress Notes (Signed)
[facility]  ROUNDING NOTE   Subjective:  BUN currently 107 with a creatinine of 1.56. However these labs were last checked on Saturday. Patient had urine output of approximately 1800 mL over the past 24 hours.   Objective:  Vital signs in last 24 hours:  Temperature 98.3 pulse 85 respirations 23 blood pressure 1:30/60 pulse ox 99%    Physical Exam: General: NAD  Head: Normocephalic, atraumatic. Moist oral mucosal membranes  Eyes: Anicteric  Neck: Tracheostomy in place  Lungs:  Scattered rhonchi, vent assisted   Heart: S1S2 no rubs  Abdomen:  Soft, nontender, BS present, PEG  Extremities:  no peripheral edema.  Neurologic: Nonfocal, moving all four extremities  Skin: No acute lesions  Access: R IJ dialysis catheter    Basic Metabolic Panel:  Recent Labs Lab 06/29/15 0630 06/30/15 0630  NA 130* 133*  K 5.0 5.3*  CL 94* 96*  CO2 27 27  GLUCOSE 209* 153*  BUN 109* 107*  CREATININE 1.51* 1.56*  CALCIUM 8.7* 8.9  PHOS 4.7*  --     Liver Function Tests:  Recent Labs Lab 06/29/15 0630  ALBUMIN 1.9*   No results for input(s): LIPASE, AMYLASE in the last 168 hours. No results for input(s): AMMONIA in the last 168 hours.  CBC:  Recent Labs Lab 06/27/15 0642  HGB 7.7*  HCT 24.3*    Cardiac Enzymes: No results for input(s): CKTOTAL, CKMB, CKMBINDEX, TROPONINI in the last 168 hours.  BNP: Invalid input(s): POCBNP  CBG: No results for input(s): GLUCAP in the last 168 hours.  Microbiology: Results for orders placed or performed during the hospital encounter of 06/19/15  Culture, Urine     Status: None   Collection Time: 06/28/15 10:15 PM  Result Value Ref Range Status   Specimen Description URINE, RANDOM  Final   Special Requests NONE  Final   Culture >=100,000 COLONIES/mL ESCHERICHIA COLI  Final   Report Status 07/01/2015 FINAL  Final   Organism ID, Bacteria ESCHERICHIA COLI  Final      Susceptibility   Escherichia coli - MIC*   AMPICILLIN >=32 RESISTANT Resistant     CEFAZOLIN <=4 SENSITIVE Sensitive     CEFTRIAXONE <=1 SENSITIVE Sensitive     CIPROFLOXACIN >=4 RESISTANT Resistant     GENTAMICIN <=1 SENSITIVE Sensitive     IMIPENEM 0.5 SENSITIVE Sensitive     NITROFURANTOIN <=16 SENSITIVE Sensitive     TRIMETH/SULFA <=20 SENSITIVE Sensitive     AMPICILLIN/SULBACTAM >=32 RESISTANT Resistant     * >=100,000 COLONIES/mL ESCHERICHIA COLI    Coagulation Studies: No results for input(s): LABPROT, INR in the last 72 hours.  Urinalysis: No results for input(s): COLORURINE, LABSPEC, PHURINE, GLUCOSEU, HGBUR, BILIRUBINUR, KETONESUR, PROTEINUR, UROBILINOGEN, NITRITE, LEUKOCYTESUR in the last 72 hours.  Invalid input(s): APPERANCEUR    Imaging: No results found.   Medications:       Assessment/ Plan:  59 y.o. female with a PMHx of COPD, hypertension, depression, hypothyroidism, paroxysmal atrial fibrillation, coronary artery disease, and recent acute renal failure, who was admitted to [facility] on 06/19/2015 for ongoing treatment of respiratory failure.   1. Acute renal failure. 2. Acute respiratory failure. 3. Gout.  4.  Anemia unspecified.  Plan:   Patient continues to have excellent urine output. However BUN continues to trend up and was 107 most recently with a creatinine 1.56. Most likely the patient has elevated BUN secondary to [place] to peds. No urgent indication for dialysis at present however if azotemia worsens  we may need to consider this. Continue to monitor renal function trend.  LOS:  [name] Fowler 11/21/20165:33 PM

## 2015-07-02 NOTE — Progress Notes (Signed)
   Name: [name] Fowler MRN: [id number] DOB: October 20, 1955    ADMISSION DATE:  06/19/2015 CONSULTATION DATE:  11/9  REFERRING MD :  Hijazi  CHIEF COMPLAINT:  Vent management   BRIEF PATIENT DESCRIPTION:  59yo female with hx depression, HTN, DM, severe COPD originally admitted to outside hospital 05/7015 with acute on chronic respiratory failure and sepsis r/t PNA and AECOPD.  Her course was c/b AFib with RVR, acute on chronic systolic heart failure, encephalopathy and AKI requiring dialysis.  She required intubation on admit and failed multiple attempts at weaning and ultimately had trach and PEG placed.  She was tx 11/8 to [facility] for further weaning efforts.   SIGNIFICANT EVENTS  Trach (OSH) 10/18>> PEG (OSH) 10/19>>>  STUDIES:    SUBJECTIVE: No distress on PSV, sitting up at bedside.    VITAL SIGNS: 98.4 hr 86 121/67 19 sats 96%   PHYSICAL EXAMINATION: General:  Chronically ill appearing female, NAD on vent sitting up at bedside Neuro:  Awake, alert, nods appropriately, slow to respond at times  HEENT:  Mm moist, no JVD  Cardiovascular:  s1s2 irreg  Lungs:  resps even non labored on full vent support,  clear  Abdomen:  Round, soft, +bs  Musculoskeletal:  Foot drop, no sig edema    Recent Labs Lab 06/29/15 0630 06/30/15 0630  NA 130* 133*  K 5.0 5.3*  CL 94* 96*  CO2 27 27  BUN 109* 107*  CREATININE 1.51* 1.56*  GLUCOSE 209* 153*    Recent Labs Lab 06/27/15 0642  HGB 7.7*  HCT 24.3*   No results found.  ASSESSMENT / PLAN:  Acute on chronic respiratory failure - s/p trach.  AECOPD  CAP  >possibly c/b by anxiety (given what has been described by RT); but also wonder about systemic demand of recent UTI.  PLAN -  Cont aggressive PSV-->and ATC today.  Taper pred Cont BDs Abx per primary  PT/OT - rec mobilize on vent   Acute on chronic systolic heart failure  PAF  E COLI uti AKI Keep dry as tol  HD per renal  HR control    Simonne MartinetPeter E Ludie Hudon  ACNP-BC [facility]Critical [facility] Pager # [phone] OR # [phone] if no answer  07/02/2015, 12:02 PM

## 2015-07-03 ENCOUNTER — Other Ambulatory Visit (HOSPITAL_COMMUNITY): Payer: Self-pay

## 2015-07-03 LAB — BASIC METABOLIC PANEL
Anion gap: 10 (ref 5–15)
BUN: 70 mg/dL — AB (ref 6–20)
CHLORIDE: 101 mmol/L (ref 101–111)
CO2: 29 mmol/L (ref 22–32)
Calcium: 9.8 mg/dL (ref 8.9–10.3)
Creatinine, Ser: 1.31 mg/dL — ABNORMAL HIGH (ref 0.44–1.00)
GFR calc Af Amer: 51 mL/min — ABNORMAL LOW (ref >60)
GFR calc non Af Amer: 44 mL/min — ABNORMAL LOW (ref >60)
GLUCOSE: 175 mg/dL — AB (ref 65–99)
POTASSIUM: 4.3 mmol/L (ref 3.5–5.1)
SODIUM: 140 mmol/L (ref 135–145)

## 2015-07-03 LAB — CBC
HEMATOCRIT: 21 % — AB (ref 36.0–46.0)
HEMOGLOBIN: 6.5 g/dL — AB (ref 12.0–15.0)
MCH: 28.8 pg (ref 26.0–34.0)
MCHC: 31 g/dL (ref 30.0–36.0)
MCV: 92.9 fL (ref 78.0–100.0)
Platelets: 352 10*3/uL (ref 150–400)
RBC: 2.26 MIL/uL — AB (ref 3.87–5.11)
RDW: 16.3 % — ABNORMAL HIGH (ref 11.5–15.5)
WBC: 9.8 10*3/uL (ref 4.0–10.5)

## 2015-07-03 LAB — PREPARE RBC (CROSSMATCH)

## 2015-07-03 LAB — ABO/RH: ABO/RH(D): A POS

## 2015-07-04 ENCOUNTER — Other Ambulatory Visit (HOSPITAL_COMMUNITY): Payer: Self-pay

## 2015-07-04 DIAGNOSIS — R042 Hemoptysis: Secondary | ICD-10-CM

## 2015-07-04 LAB — HEMOGLOBIN AND HEMATOCRIT, BLOOD
HCT: 34.5 % — ABNORMAL LOW (ref 36.0–46.0)
Hemoglobin: 10.8 g/dL — ABNORMAL LOW (ref 12.0–15.0)

## 2015-07-04 LAB — TYPE AND SCREEN
ABO/RH(D): A POS
Antibody Screen: NEGATIVE
UNIT DIVISION: 0
Unit division: 0

## 2015-07-04 NOTE — Progress Notes (Signed)
Name: [name] Fowler MRN: [id number] DOB: 1955/12/19    ADMISSION DATE:  06/19/2015 CONSULTATION DATE:  11/9  REFERRING MD :  Hijazi  CHIEF COMPLAINT:  Vent management   BRIEF PATIENT DESCRIPTION:  59yo female with hx depression, HTN, DM, severe COPD originally admitted to outside hospital 05/7015 with acute on chronic respiratory failure and sepsis r/t PNA and AECOPD.  Her course was c/b AFib with RVR, acute on chronic systolic heart failure, encephalopathy and AKI requiring dialysis.  She required intubation on admit and failed multiple attempts at weaning and ultimately had trach and PEG placed.  She was tx 11/8 to [facility] for further weaning efforts.   SIGNIFICANT EVENTS  Trach (OSH) 10/18>> PEG (OSH) 10/19>>>  STUDIES:    SUBJECTIVE: No distress on PSV, sitting up at bedside.    VITAL SIGNS: 98.4 hr 86 121/67 19 sats 96%   PHYSICAL EXAMINATION: General:  Chronically ill appearing female, NAD wants water. Now back on full distress.  Neuro:  Awake, alert, nods appropriately, slow to respond at times  HEENT:  Mm moist, no JVD  Cardiovascular:  s1s2 irreg  Lungs:  resps even non labored on full vent support,  Scattered rhonchi  Abdomen:  Round, soft, +bs  Musculoskeletal:  Foot drop, no sig edema    Recent Labs Lab 06/29/15 0630 06/30/15 0630 07/03/15 0630  NA 130* 133* 140  K 5.0 5.3* 4.3  CL 94* 96* 101  CO2 27 27 29   BUN 109* 107* 70*  CREATININE 1.51* 1.56* 1.31*  GLUCOSE 209* 153* 175*    Recent Labs Lab 07/03/15 0630  HGB 6.5*  HCT 21.0*  WBC 9.8  PLT 352   Dg Chest Port 1 View  07/03/2015  CLINICAL DATA:  Respiratory failure EXAM: PORTABLE CHEST 1 VIEW COMPARISON:  06/29/2015 FINDINGS: Stable right jugular dialysis catheter and tracheostomy tube. Stable left arm PICC. Cardiomegaly. Pulmonary edema has improved. Round density in the right upper lung zone likely represents an external object. No pneumothorax. Bilateral pleural effusions are  unchanged. IMPRESSION: Improving pulmonary edema. Electronically Signed   By: Jolaine ClickArthur  Hoss M.D.   On: 07/03/2015 07:41   Dg Abd Portable 1v  07/03/2015  CLINICAL DATA:  Abdominal pain and nausea for 1 day EXAM: PORTABLE ABDOMEN - 1 VIEW COMPARISON:  None. FINDINGS: Scattered air and stool throughout the bowel. Negative for obstruction or ileus. Entire right abdomen is not included on the portable exam. Postop changes in the right upper quadrant. Degenerative changes of the lower thoracic spine. Aortoiliac atherosclerosis evident. IMPRESSION: Negative for obstruction or ileus. Electronically Signed   By: Judie PetitM.  Shick M.D.   On: 07/03/2015 17:51    ASSESSMENT / PLAN:  Acute on chronic respiratory failure - s/p trach.  AECOPD  CAP  Hemoptysis: d-dx: suction trauma, erosion d/t trach, bronchitis or PNA.  > had been weaning. Seemed to be improving until new onset of significant hemoptysis. There is no blood to suggest bleeding from the nose, but this has been an issue w/ weaning resulting in mucous plugging/hypoxia and confusion.   PLAN -  Cont aggressive PSV-->and ATC today.  PCXR r/o PNA Hold anticoagulation Try to limit suctioning and use soft time cath to suction IF possible Send sputum for C&S Taper pred Cont BDs Abx per primary  PT/OT - rec mobilize on vent  IF hemoptysis continues may need to consider bronch on Friday  Acute on chronic systolic heart failure  PAF  E COLI uti AKI Keep dry as  tol  HD per renal  HR control    Simonne Martinet ACNP-BC [facility] Pulmonary/Critical Care Pager # [phone] OR # [phone] if no answer  07/04/2015, 10:33 AM

## 2015-07-04 NOTE — Progress Notes (Signed)
[facility]  ROUNDING NOTE   Subjective:  Patient remains on the ventilator. Currently he is awake and alert. Good urine output over the past 12 hours of 700 cc. .   Objective:  Vital signs in last 24 hours:  Temperature 96.9 pulse 93 respirations 20 room pressure 134/67 pulse ox 97%   Physical Exam: General: NAD  Head: Normocephalic, atraumatic. Moist oral mucosal membranes  Eyes: Anicteric  Neck: Tracheostomy in place  Lungs:  Scattered rhonchi, vent assisted   Heart: S1S2 no rubs  Abdomen:  Soft, nontender, BS present, PEG  Extremities:  no peripheral edema.  Neurologic: Nonfocal, moving all four extremities  Skin: No acute lesions  Access: R IJ dialysis catheter    Basic Metabolic Panel:  Recent Labs Lab 06/29/15 0630 06/30/15 0630 07/03/15 0630  NA 130* 133* 140  K 5.0 5.3* 4.3  CL 94* 96* 101  CO2 27 27 29   GLUCOSE 209* 153* 175*  BUN 109* 107* 70*  CREATININE 1.51* 1.56* 1.31*  CALCIUM 8.7* 8.9 9.8  PHOS 4.7*  --   --     Liver Function Tests:  Recent Labs Lab 06/29/15 0630  ALBUMIN 1.9*   No results for input(s): LIPASE, AMYLASE in the last 168 hours. No results for input(s): AMMONIA in the last 168 hours.  CBC:  Recent Labs Lab 07/03/15 0630  WBC 9.8  HGB 6.5*  HCT 21.0*  MCV 92.9  PLT 352    Cardiac Enzymes: No results for input(s): CKTOTAL, CKMB, CKMBINDEX, TROPONINI in the last 168 hours.  BNP: Invalid input(s): POCBNP  CBG: No results for input(s): GLUCAP in the last 168 hours.  Microbiology: Results for orders placed or performed during the hospital encounter of 06/19/15  Culture, Urine     Status: None   Collection Time: 06/28/15 10:15 PM  Result Value Ref Range Status   Specimen Description URINE, RANDOM  Final   Special Requests NONE  Final   Culture >=100,000 COLONIES/mL ESCHERICHIA COLI  Final   Report Status 07/01/2015 FINAL  Final   Organism ID, Bacteria ESCHERICHIA COLI  Final      Susceptibility    Escherichia coli - MIC*    AMPICILLIN >=32 RESISTANT Resistant     CEFAZOLIN <=4 SENSITIVE Sensitive     CEFTRIAXONE <=1 SENSITIVE Sensitive     CIPROFLOXACIN >=4 RESISTANT Resistant     GENTAMICIN <=1 SENSITIVE Sensitive     IMIPENEM 0.5 SENSITIVE Sensitive     NITROFURANTOIN <=16 SENSITIVE Sensitive     TRIMETH/SULFA <=20 SENSITIVE Sensitive     AMPICILLIN/SULBACTAM >=32 RESISTANT Resistant     * >=100,000 COLONIES/mL ESCHERICHIA COLI    Coagulation Studies: No results for input(s): LABPROT, INR in the last 72 hours.  Urinalysis: No results for input(s): COLORURINE, LABSPEC, PHURINE, GLUCOSEU, HGBUR, BILIRUBINUR, KETONESUR, PROTEINUR, UROBILINOGEN, NITRITE, LEUKOCYTESUR in the last 72 hours.  Invalid input(s): APPERANCEUR    Imaging: Dg Chest Port 1 View  07/03/2015  CLINICAL DATA:  Respiratory failure EXAM: PORTABLE CHEST 1 VIEW COMPARISON:  06/29/2015 FINDINGS: Stable right jugular dialysis catheter and tracheostomy tube. Stable left arm PICC. Cardiomegaly. Pulmonary edema has improved. Round density in the right upper lung zone likely represents an external object. No pneumothorax. Bilateral pleural effusions are unchanged. IMPRESSION: Improving pulmonary edema. Electronically Signed   By: Marybelle Killings M.D.   On: 07/03/2015 07:41   Dg Abd Portable 1v  07/03/2015  CLINICAL DATA:  Abdominal pain and nausea for 1 day EXAM: PORTABLE ABDOMEN -  1 VIEW COMPARISON:  None. FINDINGS: Scattered air and stool throughout the bowel. Negative for obstruction or ileus. Entire right abdomen is not included on the portable exam. Postop changes in the right upper quadrant. Degenerative changes of the lower thoracic spine. Aortoiliac atherosclerosis evident. IMPRESSION: Negative for obstruction or ileus. Electronically Signed   By: Jerilynn Mages.  Shick M.D.   On: 07/03/2015 17:51     Medications:       Assessment/ Plan:  59 y.o. female with a PMHx of COPD, hypertension, depression, hypothyroidism,  paroxysmal atrial fibrillation, coronary artery disease, and recent acute renal failure, who was admitted to [facility] on 06/19/2015 for ongoing treatment of respiratory failure.   1. Acute renal failure. 2. Acute respiratory failure. 3. Gout.  4.  Anemia unspecified.  Plan:   BUN now down to 70. Creatinine down to 1.31. EGFR currently 44. Therefore no acute indication for analysis. Good urine output noted. We will leave dialysis catheter in place for now. Patient remains on the ventilator with FiO2 of 35% with a PEEP of 5. Hopefully she can be weaned from the ventilator. We will continue to monitor her progress with you. Hemoglobin currently noted to be low at 6.5 and it appears that blood was transfused. Would recommend followup of CBC.  LOS:  Dezire Turk 11/23/20167:08 AM

## 2015-07-06 LAB — BASIC METABOLIC PANEL
Anion gap: 8 (ref 5–15)
BUN: 76 mg/dL — AB (ref 6–20)
CHLORIDE: 107 mmol/L (ref 101–111)
CO2: 29 mmol/L (ref 22–32)
CREATININE: 1.11 mg/dL — AB (ref 0.44–1.00)
Calcium: 9.8 mg/dL (ref 8.9–10.3)
GFR calc non Af Amer: 54 mL/min — ABNORMAL LOW (ref >60)
GLUCOSE: 77 mg/dL (ref 65–99)
POTASSIUM: 4.4 mmol/L (ref 3.5–5.1)
SODIUM: 144 mmol/L (ref 135–145)

## 2015-07-06 LAB — CBC
HCT: 32.3 % — ABNORMAL LOW (ref 36.0–46.0)
HEMOGLOBIN: 9.8 g/dL — AB (ref 12.0–15.0)
MCH: 28.4 pg (ref 26.0–34.0)
MCHC: 30.3 g/dL (ref 30.0–36.0)
MCV: 93.6 fL (ref 78.0–100.0)
PLATELETS: 284 10*3/uL (ref 150–400)
RBC: 3.45 MIL/uL — AB (ref 3.87–5.11)
RDW: 15.9 % — ABNORMAL HIGH (ref 11.5–15.5)
WBC: 10.7 10*3/uL — ABNORMAL HIGH (ref 4.0–10.5)

## 2015-07-06 NOTE — Progress Notes (Signed)
   Name: [name] Fowler MRN: [id number] DOB: [birth year]    ADMISSION DATE:  06/19/2015 CONSULTATION DATE:  11/9  REFERRING MD :  Hijazi  CHIEF COMPLAINT:  Vent management   BRIEF PATIENT DESCRIPTION:  59yo female with hx depression, HTN, DM, severe COPD originally admitted to outside hospital 05/7015 with acute on chronic respiratory failure and sepsis r/t PNA and AECOPD.  Her course was c/b AFib with RVR, acute on chronic systolic heart failure, encephalopathy and AKI requiring dialysis.  She required intubation on admit and failed multiple attempts at weaning and ultimately had trach and PEG placed.  She was tx 11/8 to [facility] for further weaning efforts.   SIGNIFICANT EVENTS  Trach (OSH) 10/18>> PEG (OSH) 10/19>>>  STUDIES:    SUBJECTIVE: No distress on PSV, sitting up at bedside.    VITAL SIGNS: 98.4 hr 86 121/67 19 sats 96%   PHYSICAL EXAMINATION: General:  Chronically ill appearing female, NAD  Neuro:  Awake, alert, nods appropriately, slow to respond at times  HEENT:  Mm moist, no JVD  Cardiovascular:  s1s2 irreg  Lungs:  resps even non labored on ATC Abdomen:  Round, soft, +bs  Musculoskeletal:  Foot drop, no sig edema    Recent Labs Lab 06/30/15 0630 07/03/15 0630 07/06/15 0550  NA 133* 140 144  K 5.3* 4.3 4.4  CL 96* 101 107  CO2 27 29 29   BUN 107* 70* 76*  CREATININE 1.56* 1.31* 1.11*  GLUCOSE 153* 175* 77    Recent Labs Lab 07/03/15 0630 07/04/15 1607 07/06/15 0550  HGB 6.5* 10.8* 9.8*  HCT 21.0* 34.5* 32.3*  WBC 9.8  --  10.7*  PLT 352  --  284   Dg Chest Port 1 View  07/04/2015  CLINICAL DATA:  Pneumonia. History of hypertension, COPD, acute renal failure. EXAM: PORTABLE CHEST 1 VIEW COMPARISON:  07/03/2015 FINDINGS: Right dialysis catheter and tracheostomy tube is well as left PICC line remain in place, unchanged. There is cardiomegaly. Bilateral airspace opacities and layering effusions are noted, similar to prior study. IMPRESSION:  Bilateral airspace disease likely reflects edema/ CHF, not significantly changed. Layering bilateral effusions. Electronically Signed   By: Charlett NoseKevin  Dover M.D.   On: 07/04/2015 14:52    ASSESSMENT / PLAN:  Acute on chronic respiratory failure - s/p trach.  AECOPD  CAP  Hemoptysis: d-dx: suction trauma, erosion d/t trach, bronchitis or PNA.-->resolving   > bleeding issues improved.  > now on ATC > looking great   PLAN -  Cont ATC per weaning protocol  Hold anticoagulation Try to limit suctioning and use soft time cath to suction IF possible Taper pred Start PMV trials Cont BDs Abx per primary  PT/OT - rec mobilize on vent   Acute on chronic systolic heart failure  PAF  E COLI uti AKI Keep dry as tol  HD per renal  HR control    Simonne MartinetPeter E Emanuel Campos ACNP-BC [facility] Pager # [phone] OR # [phone] if no answer  07/06/2015, 1:29 PM

## 2015-07-07 LAB — BASIC METABOLIC PANEL
ANION GAP: 8 (ref 5–15)
BUN: 87 mg/dL — AB (ref 6–20)
CHLORIDE: 103 mmol/L (ref 101–111)
CO2: 31 mmol/L (ref 22–32)
Calcium: 9.8 mg/dL (ref 8.9–10.3)
Creatinine, Ser: 1.21 mg/dL — ABNORMAL HIGH (ref 0.44–1.00)
GFR calc Af Amer: 56 mL/min — ABNORMAL LOW (ref >60)
GFR calc non Af Amer: 49 mL/min — ABNORMAL LOW (ref >60)
GLUCOSE: 166 mg/dL — AB (ref 65–99)
POTASSIUM: 4.6 mmol/L (ref 3.5–5.1)
Sodium: 142 mmol/L (ref 135–145)

## 2015-07-07 LAB — HEMOGLOBIN AND HEMATOCRIT, BLOOD
HCT: 31.7 % — ABNORMAL LOW (ref 36.0–46.0)
Hemoglobin: 9.8 g/dL — ABNORMAL LOW (ref 12.0–15.0)

## 2015-07-07 LAB — CULTURE, RESPIRATORY

## 2015-07-07 LAB — CULTURE, RESPIRATORY W GRAM STAIN

## 2015-07-08 LAB — BASIC METABOLIC PANEL
Anion gap: 8 (ref 5–15)
BUN: 87 mg/dL — AB (ref 6–20)
CALCIUM: 10 mg/dL (ref 8.9–10.3)
CO2: 30 mmol/L (ref 22–32)
CREATININE: 1.25 mg/dL — AB (ref 0.44–1.00)
Chloride: 103 mmol/L (ref 101–111)
GFR calc Af Amer: 54 mL/min — ABNORMAL LOW (ref >60)
GFR, EST NON AFRICAN AMERICAN: 47 mL/min — AB (ref >60)
GLUCOSE: 164 mg/dL — AB (ref 65–99)
POTASSIUM: 4.5 mmol/L (ref 3.5–5.1)
SODIUM: 141 mmol/L (ref 135–145)

## 2015-07-09 LAB — BASIC METABOLIC PANEL
ANION GAP: 8 (ref 5–15)
BUN: 90 mg/dL — AB (ref 6–20)
CALCIUM: 10.3 mg/dL (ref 8.9–10.3)
CO2: 33 mmol/L — ABNORMAL HIGH (ref 22–32)
Chloride: 102 mmol/L (ref 101–111)
Creatinine, Ser: 1.34 mg/dL — ABNORMAL HIGH (ref 0.44–1.00)
GFR calc Af Amer: 50 mL/min — ABNORMAL LOW (ref >60)
GFR, EST NON AFRICAN AMERICAN: 43 mL/min — AB (ref >60)
Glucose, Bld: 211 mg/dL — ABNORMAL HIGH (ref 65–99)
POTASSIUM: 5 mmol/L (ref 3.5–5.1)
SODIUM: 143 mmol/L (ref 135–145)

## 2015-07-09 LAB — HEMOGLOBIN AND HEMATOCRIT, BLOOD
HCT: 34.5 % — ABNORMAL LOW (ref 36.0–46.0)
HEMOGLOBIN: 10.6 g/dL — AB (ref 12.0–15.0)

## 2015-07-09 NOTE — Progress Notes (Signed)
[facility]  ROUNDING NOTE   Subjective:  Trach collar, capped Able to talk UOP 950 cc. .   Objective:  Vital signs in last 24 hours:  Temperature 98.5 pulse 64 respirations 20 room pressure 120/54   Physical Exam: General: NAD  Head: Normocephalic, atraumatic. Moist oral mucosal membranes  Eyes: Anicteric  Neck: Tracheostomy in place  Lungs:  Scattered rhonchi,   Heart: S1S2 no rubs  Abdomen:  Soft, nontender, BS present, PEG  Extremities:  no peripheral edema.  Neurologic: Nonfocal, moving all four extremities  Skin: No acute lesions  Access: R IJ dialysis catheter    Basic Metabolic Panel:  Recent Labs Lab 07/03/15 0630 07/06/15 0550 07/07/15 0629 07/08/15 0607 07/09/15 0609  NA 140 144 142 141 143  K 4.3 4.4 4.6 4.5 5.0  CL 101 107 103 103 102  CO2 33*  GLUCOSE 175* 77 166* 164* 211*  BUN 70* 76* 87* 87* 90*  CREATININE 1.31* 1.11* 1.21* 1.25* 1.34*  CALCIUM 9.8 9.8 9.8 10.0 10.3    Liver Function Tests: No results for input(s): AST, ALT, ALKPHOS, BILITOT, PROT, ALBUMIN in the last 168 hours. No results for input(s): LIPASE, AMYLASE in the last 168 hours. No results for input(s): AMMONIA in the last 168 hours.  CBC:  Recent Labs Lab 07/03/15 0630 07/04/15 1607 07/06/15 0550 07/07/15 0629 07/09/15 0609  WBC 9.8  --  10.7*  --   --   HGB 6.5* 10.8* 9.8* 9.8* 10.6*  HCT 21.0* 34.5* 32.3* 31.7* 34.5*  MCV 92.9  --  93.6  --   --   PLT 352  --  284  --   --     Cardiac Enzymes: No results for input(s): CKTOTAL, CKMB, CKMBINDEX, TROPONINI in the last 168 hours.  BNP: Invalid input(s): POCBNP  CBG: No results for input(s): GLUCAP in the last 168 hours.  Microbiology: Results for orders placed or performed during the hospital encounter of 06/19/15  Culture, Urine     Status: None   Collection Time: 06/28/15 10:15 PM  Result Value Ref Range Status   Specimen Description URINE, RANDOM  Final   Special Requests NONE   Final   Culture >=100,000 COLONIES/mL ESCHERICHIA COLI  Final   Report Status 07/01/2015 FINAL  Final   Organism ID, Bacteria ESCHERICHIA COLI  Final      Susceptibility   Escherichia coli - MIC*    AMPICILLIN >=32 RESISTANT Resistant     CEFAZOLIN <=4 SENSITIVE Sensitive     CEFTRIAXONE <=1 SENSITIVE Sensitive     CIPROFLOXACIN >=4 RESISTANT Resistant     GENTAMICIN <=1 SENSITIVE Sensitive     IMIPENEM 0.5 SENSITIVE Sensitive     NITROFURANTOIN <=16 SENSITIVE Sensitive     TRIMETH/SULFA <=20 SENSITIVE Sensitive     AMPICILLIN/SULBACTAM >=32 RESISTANT Resistant     * >=100,000 COLONIES/mL ESCHERICHIA COLI  Culture, respiratory (NON-Expectorated)     Status: None   Collection Time: 07/04/15  2:30 PM  Result Value Ref Range Status   Specimen Description TRACHEAL ASPIRATE  Final   Special Requests NONE  Final   Gram Stain   Final    FEW WBC PRESENT, PREDOMINANTLY PMN NO SQUAMOUS EPITHELIAL CELLS SEEN NO ORGANISMS SEEN Performed at [facility]    Culture   Final    Non-Pathogenic Oropharyngeal-type Flora Isolated. Performed at [facility]    Report Status 07/07/2015 FINAL  Final    Coagulation Studies: No results for  input(s): LABPROT, INR in the last 72 hours.  Urinalysis: No results for input(s): COLORURINE, LABSPEC, PHURINE, GLUCOSEU, HGBUR, BILIRUBINUR, KETONESUR, PROTEINUR, UROBILINOGEN, NITRITE, LEUKOCYTESUR in the last 72 hours.  Invalid input(s): APPERANCEUR    Imaging: No results found.   Medications:       Assessment/ Plan:  59 y.o. female with a PMHx of COPD, hypertension, depression, hypothyroidism, paroxysmal atrial fibrillation, coronary artery disease, and recent acute renal failure, who was admitted to [facility] on 06/19/2015 for ongoing treatment of respiratory failure.   1. Acute renal failure. 2. Acute respiratory failure. 3. Gout.  4.  Anemia unspecified.  Plan:   No acute indication of HD at present However,  BUN noted to have increased .will leave the dialysis cathter in for now, continue to monitor   LOS:  Gurfateh Mcclain 11/28/20163:34 PM

## 2015-07-09 NOTE — Progress Notes (Signed)
Name: [name] Fowler MRN: [id number] DOB: 04-Jun-1956    ADMISSION DATE:  06/19/2015 CONSULTATION DATE:  11/9  REFERRING MD :  Hijazi  CHIEF COMPLAINT:  Vent management   BRIEF PATIENT DESCRIPTION:  59 y/o female with hx depression, HTN, DM, severe COPD originally admitted to outside hospital 05/7015 with acute on chronic respiratory failure and sepsis r/t PNA and AECOPD.  Her course was c/b AFib with RVR, acute on chronic systolic heart failure, encephalopathy and AKI requiring dialysis.  She required intubation on admit and failed multiple attempts at weaning and ultimately had trach and PEG placed.  She was tx 11/8 to [facility] for further weaning efforts.   SIGNIFICANT EVENTS  10/18 Trach (OSH) >> 10/19 PEG (OSH) >> 11/28  Off vent x 48 hours   STUDIES:    SUBJECTIVE:  RT reports pt off vent x 48 hours.  ATC 28% via #6 ATC.  Pt reports breathing "feels good", minimal secretions.    VITAL SIGNS: 99.8, 78, 16, 98.1, 115/60  PHYSICAL EXAMINATION: General:  Chronically ill appearing female, NAD  Neuro:  Awake, alert, nods appropriately, interacts appropriately  HEENT:  Mm moist, no JVD, #6 trach c/d/i Cardiovascular:  s1s2 irreg  Lungs:  resps even non labored on ATC Abdomen:  Round, soft, +bs  Musculoskeletal:  Foot drop, no sig edema    Recent Labs Lab 07/07/15 0629 07/08/15 0607 07/09/15 0609  NA 142 141 143  K 4.6 4.5 5.0  CL 103 103 102  CO2 31 30 33*  BUN 87* 87* 90*  CREATININE 1.21* 1.25* 1.34*  GLUCOSE 166* 164* 211*    Recent Labs Lab 07/03/15 0630  07/06/15 0550 07/07/15 0629 07/09/15 0609  HGB 6.5*  < > 9.8* 9.8* 10.6*  HCT 21.0*  < > 32.3* 31.7* 34.5*  WBC 9.8  --  10.7*  --   --   PLT 352  --  284  --   --   < > = values in this interval not displayed. No results found.  ASSESSMENT / PLAN:  Acute on chronic respiratory failure - s/p trach, 48 hours off vent on 11/28.   AECOPD  CAP  Hemoptysis: d-dx: suction trauma, erosion d/t  trach, bronchitis or PNA.-->resolved 11/28  PLAN -  Cont ATC per weaning protocol  Hold anticoagulation Try to limit suctioning and use soft time cath to suction IF possible Taper prednisone to off Continue SLP / PMV trials Cont BDs Abx per primary  PT/OT - push mobilization, OOB to chair  Acute on chronic systolic heart failure  PAF  E COLI uti AKI Keep dry as tol  HD per renal  HR control     Canary BrimBrandi Ollis, NP-C [facility] Pgr: [phone] or if no answer [phone] 07/09/2015, 11:11 AM  Attending Note:  59 year old female with acute respiratory failure due to PNA in the face of severe COPD that required mechanical ventilation and failure to wean.  PCCM consulted for vent management.  Patient off the vent on TC x48 hours and doing rather well.  Lungs are clear on exam today.  I reviewed CXR myself, trach in good position, hyperinflation and some pulmonary edema.  Discussed with SSH-MD and RT.  Respiratory failure:   - Continue TC as able at this point.  - Titrate O2 for sat of 88-92%, do not allow higher sats with severe COPD.  COPD:  - Bronchodilators as ordered.  - No need for steroids at this time.  PNA: HCAP  - Continue abx as ordered.  - F/u on cultures.  Pulmonary edema due to CHF:  - Optimize CHF medications as able.  - Keep on the dry side.  - Lasix as ordered.  PCCM will see again on Thursday.  Patient seen and examined, agree with above note.  I dictated the care and orders written for this patient under my direction.  Alyson Reedy, MD [phone]

## 2015-07-11 ENCOUNTER — Other Ambulatory Visit (HOSPITAL_COMMUNITY): Payer: Self-pay

## 2015-07-11 NOTE — Progress Notes (Signed)
[facility]  ROUNDING NOTE   Subjective:  Trach  No acute c/o UOP 1250 cc .   Objective:  Vital signs in last 24 hours:  Temperature 97.3 pulse 74 respirations 16 room pressure 118/58     Physical Exam: General: NAD  Head: Normocephalic, atraumatic. Moist oral mucosal membranes  Eyes: Anicteric  Neck: Tracheostomy in place  Lungs:  Scattered rhonchi,   Heart: S1S2 no rubs  Abdomen:  Soft, nontender, BS present, PEG  Extremities:  no peripheral edema.  Neurologic: Nonfocal, moving all four extremities  Skin: No acute lesions  Access: R IJ dialysis catheter    Basic Metabolic Panel:  Recent Labs Lab 07/06/15 0550 07/07/15 0629 07/08/15 0607 07/09/15 0609  NA 144 142 141 143  K 4.4 4.6 4.5 5.0  CL 107 103 103 102  CO2 29 31 30  33*  GLUCOSE 77 166* 164* 211*  BUN 76* 87* 87* 90*  CREATININE 1.11* 1.21* 1.25* 1.34*  CALCIUM 9.8 9.8 10.0 10.3    Liver Function Tests: No results for input(s): AST, ALT, ALKPHOS, BILITOT, PROT, ALBUMIN in the last 168 hours. No results for input(s): LIPASE, AMYLASE in the last 168 hours. No results for input(s): AMMONIA in the last 168 hours.  CBC:  Recent Labs Lab 07/06/15 0550 07/07/15 0629 07/09/15 0609  WBC 10.7*  --   --   HGB 9.8* 9.8* 10.6*  HCT 32.3* 31.7* 34.5*  MCV 93.6  --   --   PLT 284  --   --     Cardiac Enzymes: No results for input(s): CKTOTAL, CKMB, CKMBINDEX, TROPONINI in the last 168 hours.  BNP: Invalid input(s): POCBNP  CBG: No results for input(s): GLUCAP in the last 168 hours.  Microbiology: Results for orders placed or performed during the hospital encounter of 06/19/15  Culture, Urine     Status: None   Collection Time: 06/28/15 10:15 PM  Result Value Ref Range Status   Specimen Description URINE, RANDOM  Final   Special Requests NONE  Final   Culture >=100,000 COLONIES/mL ESCHERICHIA COLI  Final   Report Status 07/01/2015 FINAL  Final   Organism ID, Bacteria ESCHERICHIA  COLI  Final      Susceptibility   Escherichia coli - MIC*    AMPICILLIN >=32 RESISTANT Resistant     CEFAZOLIN <=4 SENSITIVE Sensitive     CEFTRIAXONE <=1 SENSITIVE Sensitive     CIPROFLOXACIN >=4 RESISTANT Resistant     GENTAMICIN <=1 SENSITIVE Sensitive     IMIPENEM 0.5 SENSITIVE Sensitive     NITROFURANTOIN <=16 SENSITIVE Sensitive     TRIMETH/SULFA <=20 SENSITIVE Sensitive     AMPICILLIN/SULBACTAM >=32 RESISTANT Resistant     * >=100,000 COLONIES/mL ESCHERICHIA COLI  Culture, respiratory (NON-Expectorated)     Status: None   Collection Time: 07/04/15  2:30 PM  Result Value Ref Range Status   Specimen Description TRACHEAL ASPIRATE  Final   Special Requests NONE  Final   Gram Stain   Final    FEW WBC PRESENT, PREDOMINANTLY PMN NO SQUAMOUS EPITHELIAL CELLS SEEN NO ORGANISMS SEEN Performed at [facility]    Culture   Final    Non-Pathogenic Oropharyngeal-type Flora Isolated. Performed at [facility]    Report Status 07/07/2015 FINAL  Final    Coagulation Studies: No results for input(s): LABPROT, INR in the last 72 hours.  Urinalysis: No results for input(s): COLORURINE, LABSPEC, PHURINE, GLUCOSEU, HGBUR, BILIRUBINUR, KETONESUR, PROTEINUR, UROBILINOGEN, NITRITE, LEUKOCYTESUR in the last 72  hours.  Invalid input(s): APPERANCEUR    Imaging: No results found.   Medications:       Assessment/ Plan:  59 y.o. female with a PMHx of COPD, hypertension, depression, hypothyroidism, paroxysmal atrial fibrillation, coronary artery disease, and recent acute renal failure, who was admitted to [facility] on 06/19/2015 for ongoing treatment of respiratory failure.   1. Acute renal failure. 2. Acute respiratory failure. 3. Gout.  4.  Anemia unspecified.  Plan:   No acute indication of HD at present However, BUN noted to have increased will leave the dialysis cathter in for now, continue to monitor FOLLOW LABS CLOSELY   LOS:   Edana Aguado 11/30/20165:07 PM

## 2015-07-12 LAB — BASIC METABOLIC PANEL
ANION GAP: 12 (ref 5–15)
BUN: 97 mg/dL — ABNORMAL HIGH (ref 6–20)
CHLORIDE: 96 mmol/L — AB (ref 101–111)
CO2: 29 mmol/L (ref 22–32)
CREATININE: 1.81 mg/dL — AB (ref 0.44–1.00)
Calcium: 10 mg/dL (ref 8.9–10.3)
GFR calc non Af Amer: 30 mL/min — ABNORMAL LOW (ref >60)
GFR, EST AFRICAN AMERICAN: 35 mL/min — AB (ref >60)
GLUCOSE: 75 mg/dL (ref 65–99)
Potassium: 4.5 mmol/L (ref 3.5–5.1)
Sodium: 137 mmol/L (ref 135–145)

## 2015-07-12 LAB — CBC
HEMATOCRIT: 31.5 % — AB (ref 36.0–46.0)
HEMOGLOBIN: 9.7 g/dL — AB (ref 12.0–15.0)
MCH: 28.4 pg (ref 26.0–34.0)
MCHC: 30.8 g/dL (ref 30.0–36.0)
MCV: 92.1 fL (ref 78.0–100.0)
Platelets: 218 10*3/uL (ref 150–400)
RBC: 3.42 MIL/uL — ABNORMAL LOW (ref 3.87–5.11)
RDW: 15.5 % (ref 11.5–15.5)
WBC: 8.6 10*3/uL (ref 4.0–10.5)

## 2015-07-12 NOTE — Progress Notes (Signed)
Name: [name] Fowler MRN: [id number] DOB: 06-15-1956    ADMISSION DATE:  06/19/2015 CONSULTATION DATE:  11/9  REFERRING MD :  Hijazi  CHIEF COMPLAINT:  Vent management   BRIEF PATIENT DESCRIPTION:  59 y/o female with hx depression, HTN, DM, severe COPD originally admitted to outside hospital 05/7015 with acute on chronic respiratory failure and sepsis r/t PNA and AECOPD.  Her course was c/b AFib with RVR, acute on chronic systolic heart failure, encephalopathy and AKI requiring dialysis.  She required intubation on admit and failed multiple attempts at weaning and ultimately had trach and PEG placed.  She was tx 11/8 to [facility] for further weaning efforts.   SIGNIFICANT EVENTS  10/18 Trach (OSH) >> 10/19 PEG (OSH) >> 11/28  Off vent x 48 hours   STUDIES:    SUBJECTIVE:  RT reports pt off vent x 48 hours.  ATC 28% via #6 ATC.  Pt reports breathing "feels good", minimal secretions.    VITAL SIGNS: 99.8, 78, 16, 98.1, 115/60  PHYSICAL EXAMINATION: General:  Chronically ill appearing female, NAD  Neuro:  Awake, alert, nods appropriately, interacts appropriately  HEENT:  Mm moist, no JVD, #6 trach c/d/i Cardiovascular:  s1s2 irreg  Lungs:  resps even non labored on ATC Abdomen:  Round, soft, +bs  Musculoskeletal:  Foot drop, no sig edema    Recent Labs Lab 07/08/15 0607 07/09/15 0609 07/12/15 0645  NA 141 143 137  K 4.5 5.0 4.5  CL 103 102 96*  CO2 30 33* 29  BUN 87* 90* 97*  CREATININE 1.25* 1.34* 1.81*  GLUCOSE 164* 211* 75    Recent Labs Lab 07/06/15 0550 07/07/15 0629 07/09/15 0609 07/12/15 0645  HGB 9.8* 9.8* 10.6* 9.7*  HCT 32.3* 31.7* 34.5* 31.5*  WBC 10.7*  --   --  8.6  PLT 284  --   --  218   No results found.  ASSESSMENT / PLAN:  Acute on chronic respiratory failure - s/p trach, 48 hours off vent on 11/28.   AECOPD  CAP  Hemoptysis: d-dx: suction trauma, erosion d/t trach, bronchitis or PNA.-->resolved 11/28  PLAN -  Cont ATC per  weaning protocol  Hold anticoagulation Try to limit suctioning and use soft time cath to suction IF possible Taper prednisone to off Continue SLP / PMV trials Cont BDs Abx per primary  PT/OT - push mobilization, OOB to chair  Acute on chronic systolic heart failure  PAF  E COLI uti AKI Keep dry as tol  HD per renal  HR control   59 year old female with acute respiratory failure due to PNA in the face of severe COPD that required mechanical ventilation and failure to wean.  PCCM consulted for vent management.  Patient off the vent on TC x48 hours and doing rather well.  Lungs are clear on exam today.  I reviewed CXR myself, trach in good position, hyperinflation and some pulmonary edema.  Discussed with SSH-MD and RT.  Respiratory failure:   - Continue TC 24/7 at this point.  - Titrate O2 for sat of 88-92%, do not allow higher sats with severe COPD.  - Once stronger and able to cough up her own secretions then ok to decannulate from a pulmonary mechanics standpoint.  - Needs swallow evaluation.  COPD:  - Bronchodilators as ordered.  - No need for steroids at this time.  PNA: HCAP  - Continue abx as ordered.  - F/u on cultures.  Pulmonary edema due to CHF:  -  Optimize CHF medications as able.  - Keep on the dry side.  - Lasix as ordered.  PCCM will see again on Monday.  Patient seen and examined, agree with above note.  I dictated the care and orders written for this patient under my direction.  [name], MD [phone]

## 2015-07-13 LAB — BASIC METABOLIC PANEL
ANION GAP: 10 (ref 5–15)
BUN: 91 mg/dL — ABNORMAL HIGH (ref 6–20)
CHLORIDE: 96 mmol/L — AB (ref 101–111)
CO2: 29 mmol/L (ref 22–32)
CREATININE: 1.86 mg/dL — AB (ref 0.44–1.00)
Calcium: 9.7 mg/dL (ref 8.9–10.3)
GFR calc non Af Amer: 29 mL/min — ABNORMAL LOW (ref >60)
GFR, EST AFRICAN AMERICAN: 34 mL/min — AB (ref >60)
Glucose, Bld: 214 mg/dL — ABNORMAL HIGH (ref 65–99)
POTASSIUM: 4.7 mmol/L (ref 3.5–5.1)
SODIUM: 135 mmol/L (ref 135–145)

## 2015-07-13 NOTE — Progress Notes (Signed)
[facility]  ROUNDING NOTE   Subjective:  Sitting up in chair No acute c/o UOP 2500 cc .   Objective:  Vital signs in last 24 hours:  Temperature 97.0 pulse 78 respirations 18 room pressure 121/68     Physical Exam: General: NAD  Head: Normocephalic, atraumatic. Moist oral mucosal membranes  Eyes: Anicteric  Neck: Tracheostomy in place  Lungs:  Scattered rhonchi,   Heart: S1S2 no rubs  Abdomen:  Soft, nontender, BS present, PEG  Extremities:  no peripheral edema.  Neurologic: Nonfocal, moving all four extremities  Skin: No acute lesions  Access: R IJ dialysis catheter    Basic Metabolic Panel:  Recent Labs Lab 07/07/15 0629 07/08/15 0607 07/09/15 0609 07/12/15 0645 07/13/15 0600  NA 142 141 143 137 135  K 4.6 4.5 5.0 4.5 4.7  CL 103 103 102 96* 96*  CO2 31 30 33* 29 29  GLUCOSE 166* 164* 211* 75 214*  BUN 87* 87* 90* 97* 91*  CREATININE 1.21* 1.25* 1.34* 1.81* 1.86*  CALCIUM 9.8 10.0 10.3 10.0 9.7    Liver Function Tests: No results for input(s): AST, ALT, ALKPHOS, BILITOT, PROT, ALBUMIN in the last 168 hours. No results for input(s): LIPASE, AMYLASE in the last 168 hours. No results for input(s): AMMONIA in the last 168 hours.  CBC:  Recent Labs Lab 07/07/15 0629 07/09/15 0609 07/12/15 0645  WBC  --   --  8.6  HGB 9.8* 10.6* 9.7*  HCT 31.7* 34.5* 31.5*  MCV  --   --  92.1  PLT  --   --  218    Cardiac Enzymes: No results for input(s): CKTOTAL, CKMB, CKMBINDEX, TROPONINI in the last 168 hours.  BNP: Invalid input(s): POCBNP  CBG: No results for input(s): GLUCAP in the last 168 hours.  Microbiology: Results for orders placed or performed during the hospital encounter of 06/19/15  Culture, Urine     Status: None   Collection Time: 06/28/15 10:15 PM  Result Value Ref Range Status   Specimen Description URINE, RANDOM  Final   Special Requests NONE  Final   Culture >=100,000 COLONIES/mL ESCHERICHIA COLI  Final   Report Status  07/01/2015 FINAL  Final   Organism ID, Bacteria ESCHERICHIA COLI  Final      Susceptibility   Escherichia coli - MIC*    AMPICILLIN >=32 RESISTANT Resistant     CEFAZOLIN <=4 SENSITIVE Sensitive     CEFTRIAXONE <=1 SENSITIVE Sensitive     CIPROFLOXACIN >=4 RESISTANT Resistant     GENTAMICIN <=1 SENSITIVE Sensitive     IMIPENEM 0.5 SENSITIVE Sensitive     NITROFURANTOIN <=16 SENSITIVE Sensitive     TRIMETH/SULFA <=20 SENSITIVE Sensitive     AMPICILLIN/SULBACTAM >=32 RESISTANT Resistant     * >=100,000 COLONIES/mL ESCHERICHIA COLI  Culture, respiratory (NON-Expectorated)     Status: None   Collection Time: 07/04/15  2:30 PM  Result Value Ref Range Status   Specimen Description TRACHEAL ASPIRATE  Final   Special Requests NONE  Final   Gram Stain   Final    FEW WBC PRESENT, PREDOMINANTLY PMN NO SQUAMOUS EPITHELIAL CELLS SEEN NO ORGANISMS SEEN Performed at [facility]    Culture   Final    Non-Pathogenic Oropharyngeal-type Flora Isolated. Performed at [facility]    Report Status 07/07/2015 FINAL  Final    Coagulation Studies: No results for input(s): LABPROT, INR in the last 72 hours.  Urinalysis: No results for input(s): COLORURINE, LABSPEC, PHURINE,  GLUCOSEU, HGBUR, BILIRUBINUR, KETONESUR, PROTEINUR, UROBILINOGEN, NITRITE, LEUKOCYTESUR in the last 72 hours.  Invalid input(s): APPERANCEUR    Imaging: No results found.   Medications:       Assessment/ Plan:  59 y.o. female with a PMHx of COPD, hypertension, depression, hypothyroidism, paroxysmal atrial fibrillation, coronary artery disease, and recent acute renal failure, who was admitted to [facility] on 06/19/2015 for ongoing treatment of respiratory failure.   1. Acute renal failure. 2. Acute respiratory failure. 3. Gout.  4.  Anemia unspecified.  Plan:   No acute indication of HD at present However, BUN remains high. S Cr appears to be stabilizing. Today Cr is 1.86 will leave  the dialysis cathter in for now, but may be able to d/c early next week if UOP remains good and labs are stable FOLLOW LABS CLOSELY   LOS:  [name] Fowler 12/2/20168:57 AM

## 2015-07-15 LAB — CK TOTAL AND CKMB (NOT AT ARMC)
CK TOTAL: 22 U/L — AB (ref 38–234)
CK, MB: 2 ng/mL (ref 0.5–5.0)
RELATIVE INDEX: INVALID (ref 0.0–2.5)

## 2015-07-15 LAB — TROPONIN I: TROPONIN I: 0.04 ng/mL — AB (ref <0.031)

## 2015-07-16 LAB — CBC
HEMATOCRIT: 29.2 % — AB (ref 36.0–46.0)
HEMOGLOBIN: 9.3 g/dL — AB (ref 12.0–15.0)
MCH: 29.2 pg (ref 26.0–34.0)
MCHC: 31.8 g/dL (ref 30.0–36.0)
MCV: 91.5 fL (ref 78.0–100.0)
Platelets: 215 10*3/uL (ref 150–400)
RBC: 3.19 MIL/uL — AB (ref 3.87–5.11)
RDW: 15.7 % — ABNORMAL HIGH (ref 11.5–15.5)
WBC: 7.7 10*3/uL (ref 4.0–10.5)

## 2015-07-16 LAB — BASIC METABOLIC PANEL
ANION GAP: 9 (ref 5–15)
BUN: 63 mg/dL — AB (ref 6–20)
CHLORIDE: 102 mmol/L (ref 101–111)
CO2: 26 mmol/L (ref 22–32)
Calcium: 9.2 mg/dL (ref 8.9–10.3)
Creatinine, Ser: 1.75 mg/dL — ABNORMAL HIGH (ref 0.44–1.00)
GFR calc Af Amer: 36 mL/min — ABNORMAL LOW (ref >60)
GFR calc non Af Amer: 31 mL/min — ABNORMAL LOW (ref >60)
GLUCOSE: 79 mg/dL (ref 65–99)
POTASSIUM: 4.5 mmol/L (ref 3.5–5.1)
Sodium: 137 mmol/L (ref 135–145)

## 2015-07-16 LAB — TROPONIN I
TROPONIN I: 0.04 ng/mL — AB (ref <0.031)
Troponin I: 0.04 ng/mL — ABNORMAL HIGH (ref <0.031)
Troponin I: 0.04 ng/mL — ABNORMAL HIGH (ref <0.031)

## 2015-07-16 NOTE — Progress Notes (Signed)
[facility]  ROUNDING NOTE   Subjective:  Sitting up in chair No acute c/o UOP 1450 cc .   Objective:  Vital signs in last 24 hours:  Temperature 97.4 pulse 72 respirations 17 room pressure 132/69     Physical Exam: General: NAD  Head: Normocephalic, atraumatic. Moist oral mucosal membranes  Eyes: Anicteric  Neck: Tracheostomy in place  Lungs:  Scattered rhonchi,   Heart: S1S2 no rubs  Abdomen:  Soft, nontender, BS present, PEG  Extremities:  no peripheral edema.  Neurologic: Nonfocal, moving all four extremities  Skin: No acute lesions  Access: R IJ dialysis catheter    Basic Metabolic Panel:  Recent Labs Lab 07/12/15 0645 07/13/15 0600 07/16/15 0525  NA 137 135 137  K 4.5 4.7 4.5  CL 96* 96* 102  CO2 GLUCOSE 75 214* 79  BUN 97* 91* 63*  CREATININE 1.81* 1.86* 1.75*  CALCIUM 10.0 9.7 9.2    Liver Function Tests: No results for input(s): AST, ALT, ALKPHOS, BILITOT, PROT, ALBUMIN in the last 168 hours. No results for input(s): LIPASE, AMYLASE in the last 168 hours. No results for input(s): AMMONIA in the last 168 hours.  CBC:  Recent Labs Lab 07/12/15 0645 07/16/15 0525  WBC 8.6 7.7  HGB 9.7* 9.3*  HCT 31.5* 29.2*  MCV 92.1 91.5  PLT 218 215    Cardiac Enzymes:  Recent Labs Lab 07/15/15 1555 07/16/15 0010 07/16/15 0525 07/16/15 0855  CKTOTAL 22*  --   --   --   CKMB 2.0  --   --   --   TROPONINI 0.04* 0.04* 0.04* 0.04*    BNP: Invalid input(s): POCBNP  CBG: No results for input(s): GLUCAP in the last 168 hours.  Microbiology: Results for orders placed or performed during the hospital encounter of 06/19/15  Culture, Urine     Status: None   Collection Time: 06/28/15 10:15 PM  Result Value Ref Range Status   Specimen Description URINE, RANDOM  Final   Special Requests NONE  Final   Culture >=100,000 COLONIES/mL ESCHERICHIA COLI  Final   Report Status 07/01/2015 FINAL  Final   Organism ID, Bacteria  ESCHERICHIA COLI  Final      Susceptibility   Escherichia coli - MIC*    AMPICILLIN >=32 RESISTANT Resistant     CEFAZOLIN <=4 SENSITIVE Sensitive     CEFTRIAXONE <=1 SENSITIVE Sensitive     CIPROFLOXACIN >=4 RESISTANT Resistant     GENTAMICIN <=1 SENSITIVE Sensitive     IMIPENEM 0.5 SENSITIVE Sensitive     NITROFURANTOIN <=16 SENSITIVE Sensitive     TRIMETH/SULFA <=20 SENSITIVE Sensitive     AMPICILLIN/SULBACTAM >=32 RESISTANT Resistant     * >=100,000 COLONIES/mL ESCHERICHIA COLI  Culture, respiratory (NON-Expectorated)     Status: None   Collection Time: 07/04/15  2:30 PM  Result Value Ref Range Status   Specimen Description TRACHEAL ASPIRATE  Final   Special Requests NONE  Final   Gram Stain   Final    FEW WBC PRESENT, PREDOMINANTLY PMN NO SQUAMOUS EPITHELIAL CELLS SEEN NO ORGANISMS SEEN Performed at [facility]    Culture   Final    Non-Pathogenic Oropharyngeal-type Flora Isolated. Performed at [facility]    Report Status 07/07/2015 FINAL  Final    Coagulation Studies: No results for input(s): LABPROT, INR in the last 72 hours.  Urinalysis: No results for input(s): COLORURINE, LABSPEC, PHURINE, GLUCOSEU, HGBUR, BILIRUBINUR, KETONESUR, PROTEINUR, UROBILINOGEN, NITRITE, LEUKOCYTESUR  in the last 72 hours.  Invalid input(s): APPERANCEUR    Imaging: No results found.   Medications:       Assessment/ Plan:  59 y.o. female with a PMHx of COPD, hypertension, depression, hypothyroidism, paroxysmal atrial fibrillation, coronary artery disease, and recent acute renal failure, who was admitted to [facility] on 06/19/2015 for ongoing treatment of respiratory failure.   1. Acute renal failure. 2. Acute respiratory failure. 3. Gout.  4.  Anemia unspecified.  Plan:   No further need for HD is anticipated.  BUN/CRr are improving May get the dialysis catheter removed by VIR      LOS:  [name] Fowler 12/5/20163:29 PM

## 2015-07-16 NOTE — Progress Notes (Signed)
   Name: [name] Fowler MRN: [id number] DOB: [birth year]    ADMISSION DATE:  06/19/2015 CONSULTATION DATE:  06/20/2015  REFERRING MD :  Sharyon MedicusHijazi  CHIEF COMPLAINT: Short of breath  SUBJECTIVE:   Denies chest pain, chest congestion.  VITAL SIGNS: T 97.4, HR 72, RR 17, BP 132/69, SpO2 97%  PHYSICAL EXAMINATION: General: pleasant Neuro:  Awake, alert, follows commands HEENT: #6 trach site clean Cardiovascular: irregular Lungs: no wheeze Abdomen:  Soft, non tender Musculoskeletal: no edema   CMP Latest Ref Rng 07/16/2015 07/13/2015 07/12/2015  Glucose 65 - 99 mg/dL 79 409(W214(H) 75  BUN 6 - 20 mg/dL 11(B63(H) 14(N91(H) 82(N97(H)  Creatinine 0.44 - 1.00 mg/dL 5.62(Z1.75(H) 3.08(M1.86(H) 5.78(I1.81(H)  Sodium 135 - 145 mmol/L 137 135 137  Potassium 3.5 - 5.1 mmol/L 4.5 4.7 4.5  Chloride 101 - 111 mmol/L 102 96(L) 96(L)  CO2 22 - 32 mmol/L 26 29 29   Calcium 8.9 - 10.3 mg/dL 9.2 9.7 69.610.0  Total Protein 6.5 - 8.1 g/dL - - -  Total Bilirubin 0.3 - 1.2 mg/dL - - -  Alkaline Phos 38 - 126 U/L - - -  AST 15 - 41 U/L - - -  ALT 14 - 54 U/L - - -    CBC Latest Ref Rng 07/16/2015 07/12/2015 07/09/2015  WBC 4.0 - 10.5 K/uL 7.7 8.6 -  Hemoglobin 12.0 - 15.0 g/dL 2.9(B9.3(L) 2.8(U9.7(L) 10.6(L)  Hematocrit 36.0 - 46.0 % 29.2(L) 31.5(L) 34.5(L)  Platelets 150 - 400 K/uL 215 218 -    No results found.    LINES/TUBES: 10/18 Trach (OSH) >> 10/19 PEG (OSH) >>  SIGNIFICANT EVENTS: 11/28  Off vent x 48 hours   STUDIES:   DISCUSSION: 59 yo female with sepsis and acute on chronic respiratory failure  2nd to PNA and COPD exacerbation.  She required intubation, but failed to wean from vent at outside hospital.  Her hospital course was complicated by a fib with RVR, and systolic heart failure.  She has hx of severe COPD, OSA, HTN, depression, DM.  ASSESSMENT / PLAN:  Acute on chronic respiratory failure. S/p tracheostomy. Hx of COPD. Hx of OSA >> was on CPAP as outpt. Plan: - cap tracheostomy >> if she does well, then consider  decannulation soon - f/u CXR as needed - will need CPAP after decannulation - continue duoneb  Atrial fibrillation. Acute on chronic systolic CHF. Plan: - per primary team >> even to negative fluid balance as tolerated  HCAP, hemoptysis >> resolved.   [facility]  Coralyn HellingVineet Razia Screws, MD [facility] Pulmonary/Critical [facility] 07/16/2015, 3:18 PM Pager:  [phone] After 3pm call: [phone]

## 2015-07-17 ENCOUNTER — Other Ambulatory Visit (HOSPITAL_COMMUNITY): Payer: Self-pay

## 2015-07-17 MED ORDER — LIDOCAINE HCL 1 % IJ SOLN
INTRAMUSCULAR | Status: AC
  Filled 2015-07-17: qty 20

## 2015-07-17 MED ORDER — CHLORHEXIDINE GLUCONATE 4 % EX LIQD
CUTANEOUS | Status: AC
  Filled 2015-07-17: qty 15

## 2015-07-17 NOTE — Procedures (Signed)
Successful removal of tunneled (R)IJ HD catheter No complications.  Brayton ElKevin Phyllis Whitefield PA-C Interventional Radiology 07/17/2015 1:46 PM

## 2015-07-18 NOTE — Progress Notes (Signed)
[facility]  ROUNDING NOTE   Subjective:  Sitting up in chair No acute c/o UOP 775 cc Dialysis cathter was removed by VIR on 12/6 .   Objective:  Vital signs in last 24 hours:  Temperature 97.4 pulse 78 respirations 25 blood pressure 124/59     Physical Exam: General: NAD  Head: Normocephalic, atraumatic. Moist oral mucosal membranes  Eyes: Anicteric  Neck: Tracheostomy in place, capped  Lungs:  Scattered rhonchi,   Heart: S1S2 no rubs  Abdomen:  Soft, nontender, BS present, PEG  Extremities:  no peripheral edema.  Neurologic: Alert, follows commands  Skin: No acute lesions  Access: R IJ dialysis catheter, Foley    Basic Metabolic Panel:  Recent Labs Lab 07/12/15 0645 07/13/15 0600 07/16/15 0525  NA 137 135 137  K 4.5 4.7 4.5  CL 96* 96* 102  CO2 29 29 26   GLUCOSE 75 214* 79  BUN 97* 91* 63*  CREATININE 1.81* 1.86* 1.75*  CALCIUM 10.0 9.7 9.2    Liver Function Tests: No results for input(s): AST, ALT, ALKPHOS, BILITOT, PROT, ALBUMIN in the last 168 hours. No results for input(s): LIPASE, AMYLASE in the last 168 hours. No results for input(s): AMMONIA in the last 168 hours.  CBC:  Recent Labs Lab 07/12/15 0645 07/16/15 0525  WBC 8.6 7.7  HGB 9.7* 9.3*  HCT 31.5* 29.2*  MCV 92.1 91.5  PLT 218 215    Cardiac Enzymes:  Recent Labs Lab 07/15/15 1555 07/16/15 0010 07/16/15 0525 07/16/15 0855  CKTOTAL 22*  --   --   --   CKMB 2.0  --   --   --   TROPONINI 0.04* 0.04* 0.04* 0.04*    BNP: Invalid input(s): POCBNP  CBG: No results for input(s): GLUCAP in the last 168 hours.  Microbiology: Results for orders placed or performed during the hospital encounter of 06/19/15  Culture, Urine     Status: None   Collection Time: 06/28/15 10:15 PM  Result Value Ref Range Status   Specimen Description URINE, RANDOM  Final   Special Requests NONE  Final   Culture >=100,000 COLONIES/mL ESCHERICHIA COLI  Final   Report Status 07/01/2015  FINAL  Final   Organism ID, Bacteria ESCHERICHIA COLI  Final      Susceptibility   Escherichia coli - MIC*    AMPICILLIN >=32 RESISTANT Resistant     CEFAZOLIN <=4 SENSITIVE Sensitive     CEFTRIAXONE <=1 SENSITIVE Sensitive     CIPROFLOXACIN >=4 RESISTANT Resistant     GENTAMICIN <=1 SENSITIVE Sensitive     IMIPENEM 0.5 SENSITIVE Sensitive     NITROFURANTOIN <=16 SENSITIVE Sensitive     TRIMETH/SULFA <=20 SENSITIVE Sensitive     AMPICILLIN/SULBACTAM >=32 RESISTANT Resistant     * >=100,000 COLONIES/mL ESCHERICHIA COLI  Culture, respiratory (NON-Expectorated)     Status: None   Collection Time: 07/04/15  2:30 PM  Result Value Ref Range Status   Specimen Description TRACHEAL ASPIRATE  Final   Special Requests NONE  Final   Gram Stain   Final    FEW WBC PRESENT, PREDOMINANTLY PMN NO SQUAMOUS EPITHELIAL CELLS SEEN NO ORGANISMS SEEN [facility]    Culture   Final    Non-Pathogenic Oropharyngeal-type Flora Isolated. [facility]formed at [facility]    Report Status 07/07/2015 FINAL  Final    Coagulation Studies: No results for input(s): LABPROT, INR in the last 72 hours.  Urinalysis: No results for input(s): COLORURINE, LABSPEC, PHURINE,  GLUCOSEU, HGBUR, BILIRUBINUR, KETONESUR, PROTEINUR, UROBILINOGEN, NITRITE, LEUKOCYTESUR in the last 72 hours.  Invalid input(s): APPERANCEUR    Imaging: Ir Removal Tun Cv Cath W/o Fl  07/17/2015  INDICATION: Acute renal failure, resolved. Request removal of tunneled hemodialysis catheter EXAM: REMOVAL OF TUNNELED HEMODIALYSIS CATHETER MEDICATIONS: None COMPLICATIONS: None immediate PROCEDURE: Informed written consent was obtained from the patient following an explanation of the procedure, risks, benefits and alternatives to treatment. A time out was performed prior to the initiation of the procedure. Maximal barrier sterile technique was utilized including caps, mask, sterile gowns, sterile gloves, large sterile drape, hand  hygiene, and ChloraPrep. 1% lidocaine was injected under sterile conditions along the subcutaneous tunnel. Utilizing a combination of blunt dissection and gentle traction, the cuff of the catheter was exposed and the catheter was removed intact. Hemostasis was obtained with manual compression. A dressing was placed. The patient tolerated the procedure well without immediate post procedural complication. IMPRESSION: Successful removal of tunneled dialysis catheter. Read by: Brayton El PA-C Electronically Signed   By: Irish Lack M.D.   On: 07/17/2015 13:55     Medications:       Assessment/ Plan:  59 y.o. female with a PMHx of COPD, hypertension, depression, hypothyroidism, paroxysmal atrial fibrillation, coronary artery disease, and recent acute renal failure, who was admitted to [facility] on 06/19/2015 for ongoing treatment of respiratory failure.   1. Acute renal failure. 2. Acute respiratory failure. 3. Gout.  4.  Anemia unspecified.  Plan:   No further need for HD is anticipated.  BUN/CRr are improving  dialysis catheter removed by VIR 12/6      LOS:  [name] 12/7/20163:13 PM

## 2015-07-18 NOTE — Progress Notes (Signed)
Name: [name] Fowler MRN: [id number] DOB: [birth year]    ADMISSION DATE:  06/19/2015 CONSULTATION DATE:  06/20/2015  REFERRING MD :  Sharyon MedicusHijazi  CHIEF COMPLAINT: Short of breath  SUBJECTIVE:   Has dry cough.  Denies dyspnea, chest pain.  VITAL SIGNS: T 97.4, HR 72, RR 17, BP 132/69, SpO2 97%  PHYSICAL EXAMINATION: General: pleasant Neuro:  Awake, alert, follows commands HEENT: #6 trach site clean Cardiovascular: irregular Lungs: no wheeze Abdomen:  Soft, non tender Musculoskeletal: no edema   CMP Latest Ref Rng 07/16/2015 07/13/2015 07/12/2015  Glucose 65 - 99 mg/dL 79 409(W214(H) 75  BUN 6 - 20 mg/dL 11(B63(H) 14(N91(H) 82(N97(H)  Creatinine 0.44 - 1.00 mg/dL 5.62(Z1.75(H) 3.08(M1.86(H) 5.78(I1.81(H)  Sodium 135 - 145 mmol/L 137 135 137  Potassium 3.5 - 5.1 mmol/L 4.5 4.7 4.5  Chloride 101 - 111 mmol/L 102 96(L) 96(L)  CO2 22 - 32 mmol/L 26 29 29   Calcium 8.9 - 10.3 mg/dL 9.2 9.7 69.610.0  Total Protein 6.5 - 8.1 g/dL - - -  Total Bilirubin 0.3 - 1.2 mg/dL - - -  Alkaline Phos 38 - 126 U/L - - -  AST 15 - 41 U/L - - -  ALT 14 - 54 U/L - - -    CBC Latest Ref Rng 07/16/2015 07/12/2015 07/09/2015  WBC 4.0 - 10.5 K/uL 7.7 8.6 -  Hemoglobin 12.0 - 15.0 g/dL 2.9(B9.3(L) 2.8(U9.7(L) 10.6(L)  Hematocrit 36.0 - 46.0 % 29.2(L) 31.5(L) 34.5(L)  Platelets 150 - 400 K/uL 215 218 -    Ir Removal Tun Cv Cath W/o Fl  07/17/2015  INDICATION: Acute renal failure, resolved. Request removal of tunneled hemodialysis catheter EXAM: REMOVAL OF TUNNELED HEMODIALYSIS CATHETER MEDICATIONS: None COMPLICATIONS: None immediate PROCEDURE: Informed written consent was obtained from the patient following an explanation of the procedure, risks, benefits and alternatives to treatment. A time out was performed prior to the initiation of the procedure. Maximal barrier sterile technique was utilized including caps, mask, sterile gowns, sterile gloves, large sterile drape, hand hygiene, and ChloraPrep. 1% lidocaine was injected under sterile conditions along  the subcutaneous tunnel. Utilizing a combination of blunt dissection and gentle traction, the cuff of the catheter was exposed and the catheter was removed intact. Hemostasis was obtained with manual compression. A dressing was placed. The patient tolerated the procedure well without immediate post procedural complication. IMPRESSION: Successful removal of tunneled dialysis catheter. Read by: Brayton ElKevin Bruning PA-C Electronically Signed   By: Irish LackGlenn  Yamagata M.D.   On: 07/17/2015 13:55      LINES/TUBES: 10/18 Trach (OSH) >> 10/19 PEG (OSH) >>  SIGNIFICANT EVENTS: 11/28  Off vent x 48 hours   STUDIES:   DISCUSSION: 59 yo female with sepsis and acute on chronic respiratory failure  2nd to PNA and COPD exacerbation.  She required intubation, but failed to wean from vent at outside hospital.  Her hospital course was complicated by a fib with RVR, and systolic heart failure.  She has hx of severe COPD, OSA, HTN, depression, DM.  ASSESSMENT / PLAN:  Acute on chronic respiratory failure. S/p tracheostomy. Hx of COPD. Hx of OSA >> was on CPAP as outpt. Plan: - plan for decannulation 12/08 - f/u CXR as needed - will need CPAP after decannulation - continue duoneb  Atrial fibrillation. Acute on chronic systolic CHF. Plan: - per primary team >> even to negative fluid balance as tolerated  HCAP, hemoptysis >> resolved.   PCCM will sign off.  Please call if there are any difficulties  after decannulation 12/08.  Coralyn Helling, MD [facility] Pulmonary/Critical Care 07/18/2015, 3:26 PM Pager:  [phone] After 3pm call: [phone]

## 2015-07-19 LAB — RENAL FUNCTION PANEL
ANION GAP: 8 (ref 5–15)
Albumin: 2.7 g/dL — ABNORMAL LOW (ref 3.5–5.0)
BUN: 52 mg/dL — ABNORMAL HIGH (ref 6–20)
CALCIUM: 8.8 mg/dL — AB (ref 8.9–10.3)
CO2: 23 mmol/L (ref 22–32)
CREATININE: 1.58 mg/dL — AB (ref 0.44–1.00)
Chloride: 107 mmol/L (ref 101–111)
GFR calc Af Amer: 41 mL/min — ABNORMAL LOW (ref >60)
GFR calc non Af Amer: 35 mL/min — ABNORMAL LOW (ref >60)
GLUCOSE: 79 mg/dL (ref 65–99)
Phosphorus: 5.2 mg/dL — ABNORMAL HIGH (ref 2.5–4.6)
Potassium: 5.6 mmol/L — ABNORMAL HIGH (ref 3.5–5.1)
SODIUM: 138 mmol/L (ref 135–145)

## 2015-07-19 NOTE — Progress Notes (Signed)
Name: [name] Fowler MRN: [id number] DOB: 1955/08/13    ADMISSION DATE:  06/19/2015 CONSULTATION DATE:  06/20/2015  REFERRING MD :  Sharyon MedicusHijazi  CHIEF COMPLAINT: Short of breath  SUBJECTIVE:   Breathing okay.  Denies chest pain/congestion.  VITAL SIGNS: T 97.8, HR 74, RR 15, BP 134/74, SpO2 98%  PHYSICAL EXAMINATION: General: pleasant Neuro:  Awake, alert, follows commands HEENT: #6 trach site clean Cardiovascular: irregular Lungs: no wheeze Abdomen:  Soft, non tender Musculoskeletal: no edema   CMP Latest Ref Rng 07/19/2015 07/16/2015 07/13/2015  Glucose 65 - 99 mg/dL 79 79 782(N214(H)  BUN 6 - 20 mg/dL 56(O52(H) 13(Y63(H) 86(V91(H)  Creatinine 0.44 - 1.00 mg/dL 7.84(O1.58(H) 9.62(X1.75(H) 5.28(U1.86(H)  Sodium 135 - 145 mmol/L 138 137 135  Potassium 3.5 - 5.1 mmol/L 5.6(H) 4.5 4.7  Chloride 101 - 111 mmol/L 107 102 96(L)  CO2 22 - 32 mmol/L 23 26 29   Calcium 8.9 - 10.3 mg/dL 1.3(K8.8(L) 9.2 9.7  Total Protein 6.5 - 8.1 g/dL - - -  Total Bilirubin 0.3 - 1.2 mg/dL - - -  Alkaline Phos 38 - 126 U/L - - -  AST 15 - 41 U/L - - -  ALT 14 - 54 U/L - - -    CBC Latest Ref Rng 07/16/2015 07/12/2015 07/09/2015  WBC 4.0 - 10.5 K/uL 7.7 8.6 -  Hemoglobin 12.0 - 15.0 g/dL 4.4(W9.3(L) 1.0(U9.7(L) 10.6(L)  Hematocrit 36.0 - 46.0 % 29.2(L) 31.5(L) 34.5(L)  Platelets 150 - 400 K/uL 215 218 -    Ir Removal Tun Cv Cath W/o Fl  07/17/2015  INDICATION: Acute renal failure, resolved. Request removal of tunneled hemodialysis catheter EXAM: REMOVAL OF TUNNELED HEMODIALYSIS CATHETER MEDICATIONS: None COMPLICATIONS: None immediate PROCEDURE: Informed written consent was obtained from the patient following an explanation of the procedure, risks, benefits and alternatives to treatment. A time out was performed prior to the initiation of the procedure. Maximal barrier sterile technique was utilized including caps, mask, sterile gowns, sterile gloves, large sterile drape, hand hygiene, and ChloraPrep. 1% lidocaine was injected under sterile conditions  along the subcutaneous tunnel. Utilizing a combination of blunt dissection and gentle traction, the cuff of the catheter was exposed and the catheter was removed intact. Hemostasis was obtained with manual compression. A dressing was placed. The patient tolerated the procedure well without immediate post procedural complication. IMPRESSION: Successful removal of tunneled dialysis catheter. Read by: Brayton ElKevin Bruning PA-C Electronically Signed   By: Irish LackGlenn  Yamagata M.D.   On: 07/17/2015 13:55      LINES/TUBES: 10/18 Trach (OSH) >> 10/19 PEG (OSH) >>  SIGNIFICANT EVENTS: 11/28  Off vent x 48 hours   STUDIES:   DISCUSSION: 59 yo female with sepsis and acute on chronic respiratory failure  2nd to PNA and COPD exacerbation.  She required intubation, but failed to wean from vent at outside hospital.  Her hospital course was complicated by a fib with RVR, and systolic heart failure.  She has hx of severe COPD, OSA, HTN, depression, DM.  ASSESSMENT / PLAN:  Acute on chronic respiratory failure. S/p tracheostomy. Hx of COPD. Hx of OSA >> was on CPAP as outpt. Plan: - proceed with decannulation 12/08 - f/u CXR as needed - assess need for CPAP after decannulation - continue duoneb  Atrial fibrillation. Acute on chronic systolic CHF. Plan: - per primary team >> even to negative fluid balance as tolerated  HCAP, hemoptysis >> resolved.   [facility]  Coralyn HellingVineet Onetta Spainhower, MD [facility] Pulmonary/Critical Care 07/19/2015, 9:54 AM  Pager:  [phone] After 3pm call: [phone]

## 2015-07-20 LAB — POTASSIUM: Potassium: 5 mmol/L (ref 3.5–5.1)

## 2015-07-20 NOTE — Progress Notes (Signed)
[facility]  ROUNDING NOTE   Subjective:  Sitting up in chair No acute c/o Dialysis cathter was removed by VIR on 12/6 [name] Fowler has been removed. .   Objective:  Vital signs in last 24 hours:  Temperature 97.1 pulse 86 respirations 19 blood pressure 128/69     Physical Exam: General: NAD  Head: Normocephalic, atraumatic. Moist oral mucosal membranes  Eyes: Anicteric  Neck: Bandage over trach site  Lungs:  Clear b/l, normal effort  Heart: S1S2 no rubs  Abdomen:  Soft, nontender,    Extremities:  no peripheral edema.  Neurologic: Alert, follows commands  Skin: No acute lesions  Access:  Foley    Basic Metabolic Panel:  Recent Labs Lab 07/16/15 0525 07/19/15 0630 07/20/15 0550  NA 137 138  --   K 4.5 5.6* 5.0  CL 102 107  --   CO2 26 23  --   GLUCOSE 79 79  --   BUN 63* 52*  --   CREATININE 1.75* 1.58*  --   CALCIUM 9.2 8.8*  --   PHOS  --  5.2*  --     Liver Function Tests:  Recent Labs Lab 07/19/15 0630  ALBUMIN 2.7*   No results for input(s): LIPASE, AMYLASE in the last 168 hours. No results for input(s): AMMONIA in the last 168 hours.  CBC:  Recent Labs Lab 07/16/15 0525  WBC 7.7  HGB 9.3*  HCT 29.2*  MCV 91.5  PLT 215    Cardiac Enzymes:  Recent Labs Lab 07/15/15 1555 07/16/15 0010 07/16/15 0525 07/16/15 0855  CKTOTAL 22*  --   --   --   CKMB 2.0  --   --   --   TROPONINI 0.04* 0.04* 0.04* 0.04*    BNP: Invalid input(s): POCBNP  CBG: No results for input(s): GLUCAP in the last 168 hours.  Microbiology: Results for orders placed or performed during the hospital encounter of 06/19/15  Culture, Urine     Status: None   Collection Time: 06/28/15 10:15 PM  Result Value Ref Range Status   Specimen Description URINE, RANDOM  Final   Special Requests NONE  Final   Culture >=100,000 COLONIES/mL ESCHERICHIA COLI  Final   Report Status 07/01/2015 FINAL  Final   Organism ID, Bacteria ESCHERICHIA COLI  Final   Susceptibility   Escherichia coli - MIC*    AMPICILLIN >=32 RESISTANT Resistant     CEFAZOLIN <=4 SENSITIVE Sensitive     CEFTRIAXONE <=1 SENSITIVE Sensitive     CIPROFLOXACIN >=4 RESISTANT Resistant     GENTAMICIN <=1 SENSITIVE Sensitive     IMIPENEM 0.5 SENSITIVE Sensitive     NITROFURANTOIN <=16 SENSITIVE Sensitive     TRIMETH/SULFA <=20 SENSITIVE Sensitive     AMPICILLIN/SULBACTAM >=32 RESISTANT Resistant     * >=100,000 COLONIES/mL ESCHERICHIA COLI  Culture, respiratory (NON-Expectorated)     Status: None   Collection Time: 07/04/15  2:30 PM  Result Value Ref Range Status   Specimen Description TRACHEAL ASPIRATE  Final   Special Requests NONE  Final   Gram Stain   Final    FEW WBC PRESENT, PREDOMINANTLY PMN NO SQUAMOUS EPITHELIAL CELLS SEEN NO ORGANISMS SEEN Performed at [facility]    Culture   Final    Non-Pathogenic Oropharyngeal-type Flora Isolated. Performed at [facility]    Report Status 07/07/2015 FINAL  Final    Coagulation Studies: No results for input(s): LABPROT, INR in the last 72 hours.  Urinalysis: No  results for input(s): COLORURINE, LABSPEC, PHURINE, GLUCOSEU, HGBUR, BILIRUBINUR, KETONESUR, PROTEINUR, UROBILINOGEN, NITRITE, LEUKOCYTESUR in the last 72 hours.  Invalid input(s): APPERANCEUR    Imaging: No results found.  Results for [name] Fowler, [name] (MRN [id number]) as of 07/20/2015 15:45  Ref. Range 07/12/2015 06:45 07/13/2015 06:00 07/16/2015 05:25 07/19/2015 06:30 07/20/2015 05:50  Creatinine Latest Ref Range: 0.44-1.00 mg/dL 0.861.81 (H) 5.781.86 (H) 4.691.75 (H) 1.58 (H)     Medications:       Assessment/ Plan:  59 y.o. female with a PMHx of COPD, hypertension, depression, hypothyroidism, paroxysmal atrial fibrillation, coronary artery disease, and recent acute renal failure, who was admitted to [facility] on 06/19/2015 for ongoing treatment of respiratory failure.   1. Acute renal failure. 2. Acute respiratory failure. 3. Gout.   4.  Anemia unspecified.  Plan:   No further need for HD is anticipated.  BUN/CRr are improving  dialysis catheter removed by VIR 12/6 S cr 1.58/GFR 35      LOS:  [name] Fowler 12/9/20163:43 PM

## 2015-07-21 LAB — POTASSIUM: Potassium: 5.6 mmol/L — ABNORMAL HIGH (ref 3.5–5.1)

## 2015-07-22 LAB — BASIC METABOLIC PANEL
Anion gap: 6 (ref 5–15)
BUN: 34 mg/dL — AB (ref 6–20)
CHLORIDE: 107 mmol/L (ref 101–111)
CO2: 26 mmol/L (ref 22–32)
CREATININE: 1.3 mg/dL — AB (ref 0.44–1.00)
Calcium: 8.4 mg/dL — ABNORMAL LOW (ref 8.9–10.3)
GFR calc Af Amer: 52 mL/min — ABNORMAL LOW (ref >60)
GFR calc non Af Amer: 45 mL/min — ABNORMAL LOW (ref >60)
Glucose, Bld: 85 mg/dL (ref 65–99)
Potassium: 4.8 mmol/L (ref 3.5–5.1)
Sodium: 139 mmol/L (ref 135–145)

## 2015-07-23 LAB — RENAL FUNCTION PANEL
ALBUMIN: 2.4 g/dL — AB (ref 3.5–5.0)
Anion gap: 8 (ref 5–15)
BUN: 33 mg/dL — AB (ref 6–20)
CALCIUM: 9.8 mg/dL (ref 8.9–10.3)
CO2: 28 mmol/L (ref 22–32)
CREATININE: 1.38 mg/dL — AB (ref 0.44–1.00)
Chloride: 104 mmol/L (ref 101–111)
GFR calc Af Amer: 48 mL/min — ABNORMAL LOW (ref >60)
GFR, EST NON AFRICAN AMERICAN: 42 mL/min — AB (ref >60)
GLUCOSE: 102 mg/dL — AB (ref 65–99)
PHOSPHORUS: 4.6 mg/dL (ref 2.5–4.6)
Potassium: 4.6 mmol/L (ref 3.5–5.1)
SODIUM: 140 mmol/L (ref 135–145)

## 2015-07-23 NOTE — Progress Notes (Signed)
[facility]  ROUNDING NOTE   Subjective:  No acute c/o Dialysis cathter was removed by VIR on 12/6 [name] Fowler has been removed. .   Objective:  Vital signs in last 24 hours:  Temperature 98.7 pulse 66 respirations 16 blood pressure 113/54    Physical Exam: General: NAD  Head: Normocephalic, atraumatic. Moist oral mucosal membranes  Eyes: Anicteric  Neck: Bandage over trach site  Lungs:  Clear b/l, normal effort  Heart: S1S2 no rubs  Abdomen:  Soft, nontender,    Extremities:  no peripheral edema.  Neurologic: Alert, follows commands  Skin: No acute lesions  Access:  Foley    Basic Metabolic Panel:  Recent Labs Lab 07/19/15 0630 07/20/15 0550 07/21/15 0654 07/22/15 0726 07/23/15 0734  NA 138  --   --  139 140  K 5.6* 5.0 5.6* 4.8 4.6  CL 107  --   --  107 104  CO2 23  --   --  26 28  GLUCOSE 79  --   --  85 102*  BUN 52*  --   --  34* 33*  CREATININE 1.58*  --   --  1.30* 1.38*  CALCIUM 8.8*  --   --  8.4* 9.8  PHOS 5.2*  --   --   --  4.6    Liver Function Tests:  Recent Labs Lab 07/19/15 0630 07/23/15 0734  ALBUMIN 2.7* 2.4*   No results for input(s): LIPASE, AMYLASE in the last 168 hours. No results for input(s): AMMONIA in the last 168 hours.  CBC: No results for input(s): WBC, NEUTROABS, HGB, HCT, MCV, PLT in the last 168 hours.  Cardiac Enzymes: No results for input(s): CKTOTAL, CKMB, CKMBINDEX, TROPONINI in the last 168 hours.  BNP: Invalid input(s): POCBNP  CBG: No results for input(s): GLUCAP in the last 168 hours.  Microbiology: Results for orders placed or performed during the hospital encounter of 06/19/15  Culture, Urine     Status: None   Collection Time: 06/28/15 10:15 PM  Result Value Ref Range Status   Specimen Description URINE, RANDOM  Final   Special Requests NONE  Final   Culture >=100,000 COLONIES/mL ESCHERICHIA COLI  Final   Report Status 07/01/2015 FINAL  Final   Organism ID, Bacteria ESCHERICHIA COLI   Final      Susceptibility   Escherichia coli - MIC*    AMPICILLIN >=32 RESISTANT Resistant     CEFAZOLIN <=4 SENSITIVE Sensitive     CEFTRIAXONE <=1 SENSITIVE Sensitive     CIPROFLOXACIN >=4 RESISTANT Resistant     GENTAMICIN <=1 SENSITIVE Sensitive     IMIPENEM 0.5 SENSITIVE Sensitive     NITROFURANTOIN <=16 SENSITIVE Sensitive     TRIMETH/SULFA <=20 SENSITIVE Sensitive     AMPICILLIN/SULBACTAM >=32 RESISTANT Resistant     * >=100,000 COLONIES/mL ESCHERICHIA COLI  Culture, respiratory (NON-Expectorated)     Status: None   Collection Time: 07/04/15  2:30 PM  Result Value Ref Range Status   Specimen Description TRACHEAL ASPIRATE  Final   Special Requests NONE  Final   Gram Stain   Final    FEW WBC PRESENT, PREDOMINANTLY PMN NO SQUAMOUS EPITHELIAL CELLS SEEN NO ORGANISMS SEEN Performed at [facility]    Culture   Final    Non-Pathogenic Oropharyngeal-type Flora Isolated. Performed at [facility]    Report Status 07/07/2015 FINAL  Final    Coagulation Studies: No results for input(s): LABPROT, INR in the last 72 hours.  Urinalysis:  No results for input(s): COLORURINE, LABSPEC, PHURINE, GLUCOSEU, HGBUR, BILIRUBINUR, KETONESUR, PROTEINUR, UROBILINOGEN, NITRITE, LEUKOCYTESUR in the last 72 hours.  Invalid input(s): APPERANCEUR    Imaging: No results found.  Results for [name] Fowler, [name] Fowler (MRN [id number]) as of 07/20/2015 15:45  Ref. Range 07/12/2015 06:45 07/13/2015 06:00 07/16/2015 05:25 07/19/2015 06:30 07/20/2015 05:50  Creatinine Latest Ref Range: 0.44-1.00 mg/dL 4.09 (H) 8.11 (H) 9.14 (H) 1.58 (H)     Medications:       Assessment/ Plan:  59 y.o. female with a PMHx of COPD, hypertension, depression, hypothyroidism, paroxysmal atrial fibrillation, coronary artery disease, and recent acute renal failure, who was admitted to [facility] on 06/19/2015 for ongoing treatment of respiratory failure.   1. Acute renal failure. 2. Acute respiratory  failure. 3. Gout.  4.  Anemia unspecified. 5. CKD st 3 - may have established a new baseline of 1.38/GFR 42  Plan:   No further need for HD is anticipated.  BUN/CRr are improving  dialysis catheter removed by VIR 12/6 D/c foley      LOS:  Hansford Hirt 12/12/20163:43 PM

## 2015-07-23 NOTE — Progress Notes (Signed)
Name: [name] Fowler MRN: [id number] DOB: 02-27-1956    ADMISSION DATE:  06/19/2015 CONSULTATION DATE:  06/20/2015  REFERRING MD :  Sharyon MedicusHijazi  CHIEF COMPLAINT: Short of breath  SUBJECTIVE:   Decannulated 12/8. Breathing fine. Denies SOB, still has minimally productive cough for thick brown secretions, overall this is improving.    VITAL SIGNS: T 98.4, HR 77, RR 23, BP 149/70, SpO2 94%  PHYSICAL EXAMINATION: General: Pleasant female in NAD Neuro:  Awake, alert, follows commands HEENT: Stoma closed Cardiovascular: irregular Lungs: Scant scattered wheeze, bilateral Abdomen:  Soft, non tender Musculoskeletal: no edema   CMP Latest Ref Rng 07/23/2015 07/22/2015 07/21/2015  Glucose 65 - 99 mg/dL 829(F102(H) 85 -  BUN 6 - 20 mg/dL 62(Z33(H) 30(Q34(H) -  Creatinine 0.44 - 1.00 mg/dL 6.57(Q1.38(H) 4.69(G1.30(H) -  Sodium 135 - 145 mmol/L 140 139 -  Potassium 3.5 - 5.1 mmol/L 4.6 4.8 5.6(H)  Chloride 101 - 111 mmol/L 104 107 -  CO2 22 - 32 mmol/L 28 26 -  Calcium 8.9 - 10.3 mg/dL 9.8 2.9(B8.4(L) -  Total Protein 6.5 - 8.1 g/dL - - -  Total Bilirubin 0.3 - 1.2 mg/dL - - -  Alkaline Phos 38 - 126 U/L - - -  AST 15 - 41 U/L - - -  ALT 14 - 54 U/L - - -    CBC Latest Ref Rng 07/16/2015 07/12/2015 07/09/2015  WBC 4.0 - 10.5 K/uL 7.7 8.6 -  Hemoglobin 12.0 - 15.0 g/dL 2.8(U9.3(L) 1.3(K9.7(L) 10.6(L)  Hematocrit 36.0 - 46.0 % 29.2(L) 31.5(L) 34.5(L)  Platelets 150 - 400 K/uL 215 218 -    No results found.    LINES/TUBES: 10/18 Trach (OSH) >> 10/19 PEG (OSH) >>  SIGNIFICANT EVENTS: 11/28  Off vent x 48 hours   STUDIES:   DISCUSSION: 59 yo female with sepsis and acute on chronic respiratory failure  2nd to PNA and COPD exacerbation.  She required intubation, but failed to wean from vent at outside hospital.  Her hospital course was complicated by a fib with RVR, and systolic heart failure.  She has hx of severe COPD, OSA, HTN, depression, DM.  ASSESSMENT / PLAN:  Acute on chronic respiratory failure. S/p  tracheostomy. Hx of COPD. Hx of OSA >> was on CPAP as outpt. Plan: - f/u CXR as needed - Will defer restarting CPAP for now, has not used since decannulation and has not been desaturating overnight.  - continue duoneb scheduled  Atrial fibrillation. Acute on chronic systolic CHF. Plan: - per primary team >> even to negative fluid balance as tolerated  HCAP, hemoptysis >> resolved.  PCCM will sign off  Joneen Roachaul Hoffman, AGACNP-BC [facility] Pager [phone] or [phone]  07/23/2015 11:55 AM     STAFF NOTE: Cindi CarbonI, Daniel Feinstein, MD FACP have personally reviewed patient's available data, including medical history, events of note, physical examination and test results as part of my evaluation. I have discussed with resident/NP and other care providers such as pharmacist, RN and RRT. In addition, I personally evaluated patient and elicited key findings of: no distress decannulation at 24 hr, closing woudn nicelty, no dc, she phonates well, eating well, coughing any minimal secretions through mouth, all good signs, she described osa and then 50 pounds wt loss that helped, would recommend outpt sleep study in future, wil sign off Monitor at night pulse ox, if she desats or apnea, call us back Mcarthur Rossettianiel J. Tyson AliasFeinstein, MD, FACP Pgr: [phone] [facility] 07/23/2015 4:50  PM   

## 2015-07-25 LAB — RENAL FUNCTION PANEL
ANION GAP: 8 (ref 5–15)
Albumin: 2.5 g/dL — ABNORMAL LOW (ref 3.5–5.0)
BUN: 29 mg/dL — ABNORMAL HIGH (ref 6–20)
CO2: 26 mmol/L (ref 22–32)
Calcium: 9.2 mg/dL (ref 8.9–10.3)
Chloride: 106 mmol/L (ref 101–111)
Creatinine, Ser: 1.37 mg/dL — ABNORMAL HIGH (ref 0.44–1.00)
GFR calc Af Amer: 49 mL/min — ABNORMAL LOW (ref >60)
GFR calc non Af Amer: 42 mL/min — ABNORMAL LOW (ref >60)
GLUCOSE: 131 mg/dL — AB (ref 65–99)
PHOSPHORUS: 5 mg/dL — AB (ref 2.5–4.6)
POTASSIUM: 4.1 mmol/L (ref 3.5–5.1)
SODIUM: 140 mmol/L (ref 135–145)

## 2015-07-25 NOTE — Progress Notes (Signed)
[facility]  ROUNDING NOTE   Subjective:  No acute c/o Dialysis cathter was removed by VIR on 12/6 [name] Fowler has been removed. Doing well/ Expected discharge today .   Objective:  Vital signs in last 24 hours:  Temperature 97.6 pulse 86 respirations 11 blood pressure 144/66    Physical Exam: General: NAD  Head: Normocephalic, atraumatic. Moist oral mucosal membranes  Eyes: Anicteric  Neck: trach site clean  Lungs:  Clear b/l, normal effort  Heart: S1S2 no rubs  Abdomen:  Soft, nontender,    Extremities:  no peripheral edema.  Neurologic: Alert, follows commands  Skin: No acute lesions  Access:     Basic Metabolic Panel:  Recent Labs Lab 07/19/15 0630 07/20/15 0550 07/21/15 0654 07/22/15 0726 07/23/15 0734 07/25/15 0700  NA 138  --   --  139 140 140  K 5.6* 5.0 5.6* 4.8 4.6 4.1  CL 107  --   --  107 104 106  CO2 23  --   --  GLUCOSE 79  --   --  85 102* 131*  BUN 52*  --   --  34* 33* 29*  CREATININE 1.58*  --   --  1.30* 1.38* 1.37*  CALCIUM 8.8*  --   --  8.4* 9.8 9.2  PHOS 5.2*  --   --   --  4.6 5.0*    Liver Function Tests:  Recent Labs Lab 07/19/15 0630 07/23/15 0734 07/25/15 0700  ALBUMIN 2.7* 2.4* 2.5*   No results for input(s): LIPASE, AMYLASE in the last 168 hours. No results for input(s): AMMONIA in the last 168 hours.  CBC: No results for input(s): WBC, NEUTROABS, HGB, HCT, MCV, PLT in the last 168 hours.  Cardiac Enzymes: No results for input(s): CKTOTAL, CKMB, CKMBINDEX, TROPONINI in the last 168 hours.  BNP: Invalid input(s): POCBNP  CBG: No results for input(s): GLUCAP in the last 168 hours.  Microbiology: Results for orders placed or performed during the hospital encounter of 06/19/15  Culture, Urine     Status: None   Collection Time: 06/28/15 10:15 PM  Result Value Ref Range Status   Specimen Description URINE, RANDOM  Final   Special Requests NONE  Final   Culture >=100,000 COLONIES/mL ESCHERICHIA  COLI  Final   Report Status 07/01/2015 FINAL  Final   Organism ID, Bacteria ESCHERICHIA COLI  Final      Susceptibility   Escherichia coli - MIC*    AMPICILLIN >=32 RESISTANT Resistant     CEFAZOLIN <=4 SENSITIVE Sensitive     CEFTRIAXONE <=1 SENSITIVE Sensitive     CIPROFLOXACIN >=4 RESISTANT Resistant     GENTAMICIN <=1 SENSITIVE Sensitive     IMIPENEM 0.5 SENSITIVE Sensitive     NITROFURANTOIN <=16 SENSITIVE Sensitive     TRIMETH/SULFA <=20 SENSITIVE Sensitive     AMPICILLIN/SULBACTAM >=32 RESISTANT Resistant     * >=100,000 COLONIES/mL ESCHERICHIA COLI  Culture, respiratory (NON-Expectorated)     Status: None   Collection Time: 07/04/15  2:30 PM  Result Value Ref Range Status   Specimen Description TRACHEAL ASPIRATE  Final   Special Requests NONE  Final   Gram Stain   Final    FEW WBC PRESENT, PREDOMINANTLY PMN NO SQUAMOUS EPITHELIAL CELLS SEEN NO ORGANISMS SEEN Performed at [facility]    Culture   Final    Non-Pathogenic Oropharyngeal-type Flora Isolated. Performed at [facility]    Report Status 07/07/2015 FINAL  Final  Coagulation Studies: No results for input(s): LABPROT, INR in the last 72 hours.  Urinalysis: No results for input(s): COLORURINE, LABSPEC, PHURINE, GLUCOSEU, HGBUR, BILIRUBINUR, KETONESUR, PROTEINUR, UROBILINOGEN, NITRITE, LEUKOCYTESUR in the last 72 hours.  Invalid input(s): APPERANCEUR    Imaging: No results found.  Results for [name] Fowler, [name] Fowler (MRN [id number]) as of 07/20/2015 15:45  Ref. Range 07/12/2015 06:45 07/13/2015 06:00 07/16/2015 05:25 07/19/2015 06:30 07/20/2015 05:50  Creatinine Latest Ref Range: 0.44-1.00 mg/dL 4.091.81 (H) 8.111.86 (H) 9.141.75 (H) 1.58 (H)     Medications:       Assessment/ Plan:  59 y.o. female with a PMHx of COPD, hypertension, depression, hypothyroidism, paroxysmal atrial fibrillation, coronary artery disease, and recent acute renal failure, who was admitted to [facility] on 06/19/2015 for  ongoing treatment of respiratory failure.   1. Acute renal failure. 2. Acute respiratory failure. 3. Gout.  4.  Anemia unspecified. 5. CKD st 3 - may have established a new baseline of 1.37/GFR 42  Plan:   No further need for HD is anticipated.  BUN/CRr stabilizing at 1.37/GFR 42        LOS:  [name] Fowler 12/14/20163:16 PM

## 2015-10-10 DEATH — deceased

## 2017-05-29 IMAGING — DX DG CHEST 1V PORT
1 series · 1 of 1 positions shown · non-contrast
Comparison: Single view of the chest 06/20/2015.

CLINICAL DATA: Respiratory failure.

EXAM:
PORTABLE CHEST 1 VIEW

[chest ap]
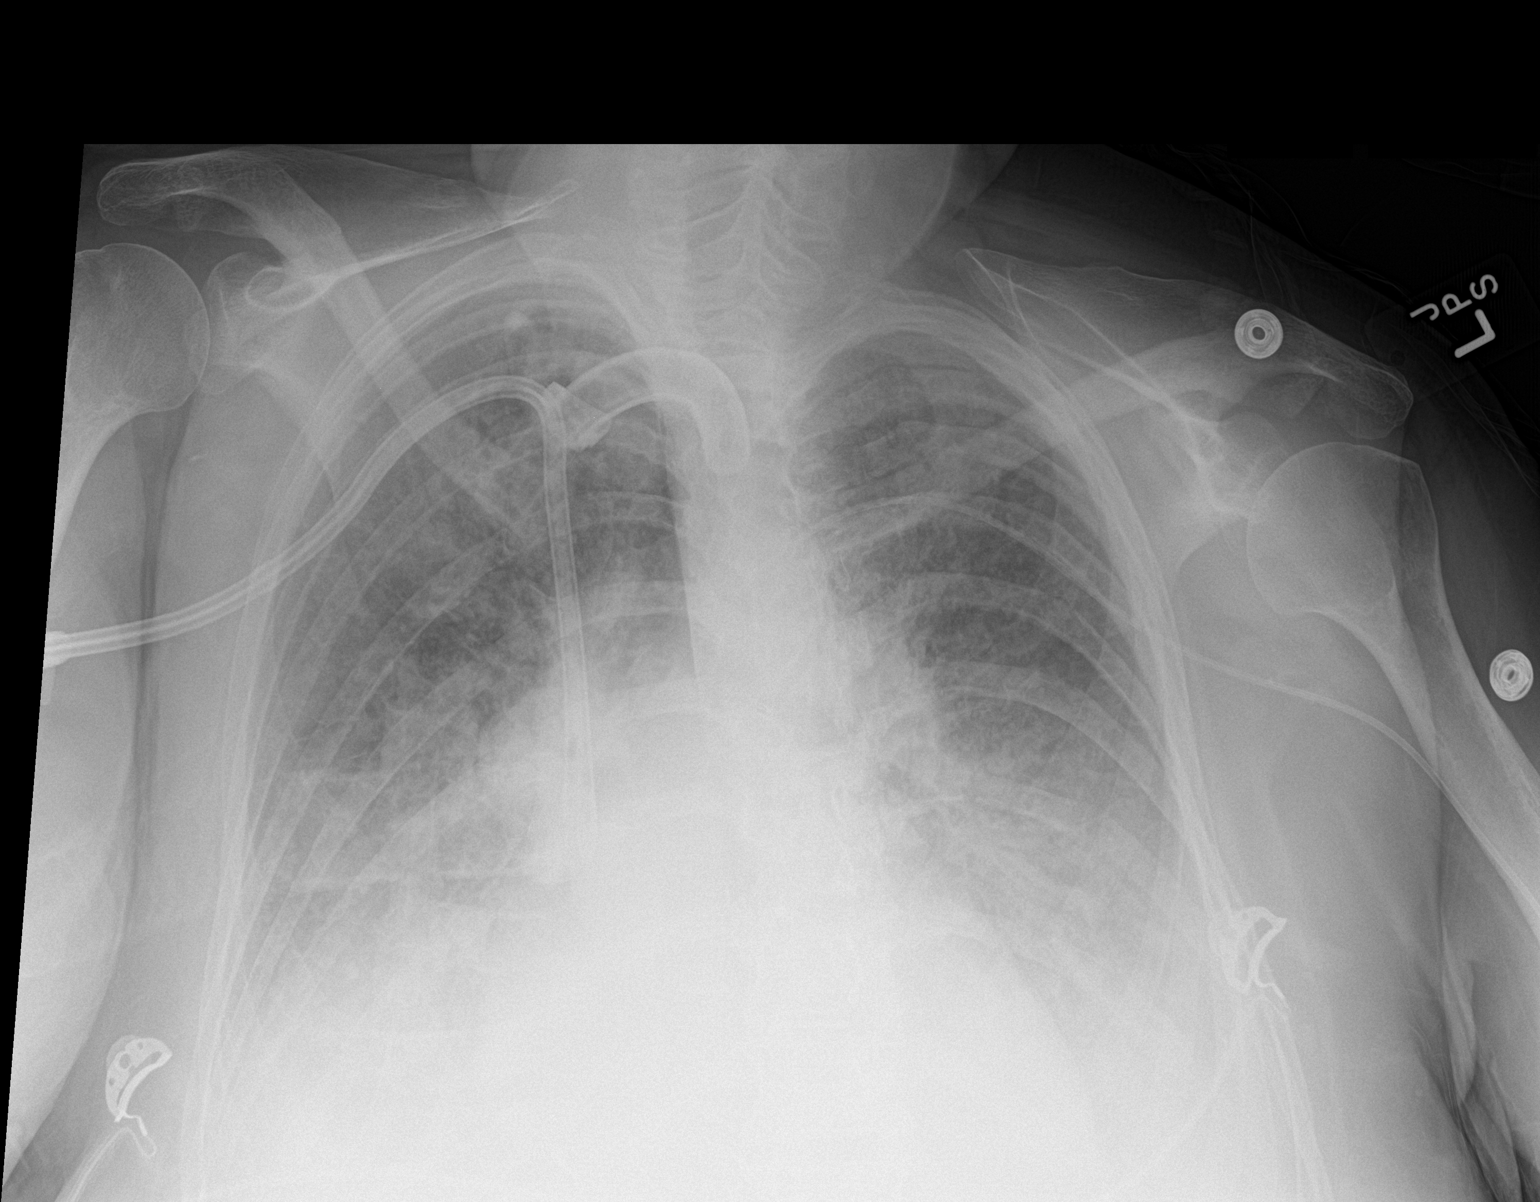

[1 of 1 positions shown; findings below may reference images not displayed]

FINDINGS: Tracheostomy tube, left PICC and right IJ approach dialysis catheter
all remain in place. Bilateral pleural effusions have markedly
increased since the prior examination. There is cardiomegaly and
pulmonary edema. Basilar atelectasis is noted.
IMPRESSION: Marked worsening of pulmonary edema and bilateral pleural effusions.

## 2017-06-02 IMAGING — CR DG ABD PORTABLE 1V
1 series · 1 of 1 positions shown · non-contrast
Comparison: None.

CLINICAL DATA: Abdominal pain and nausea for 1 day

EXAM:
PORTABLE ABDOMEN - 1 VIEW

[AP]
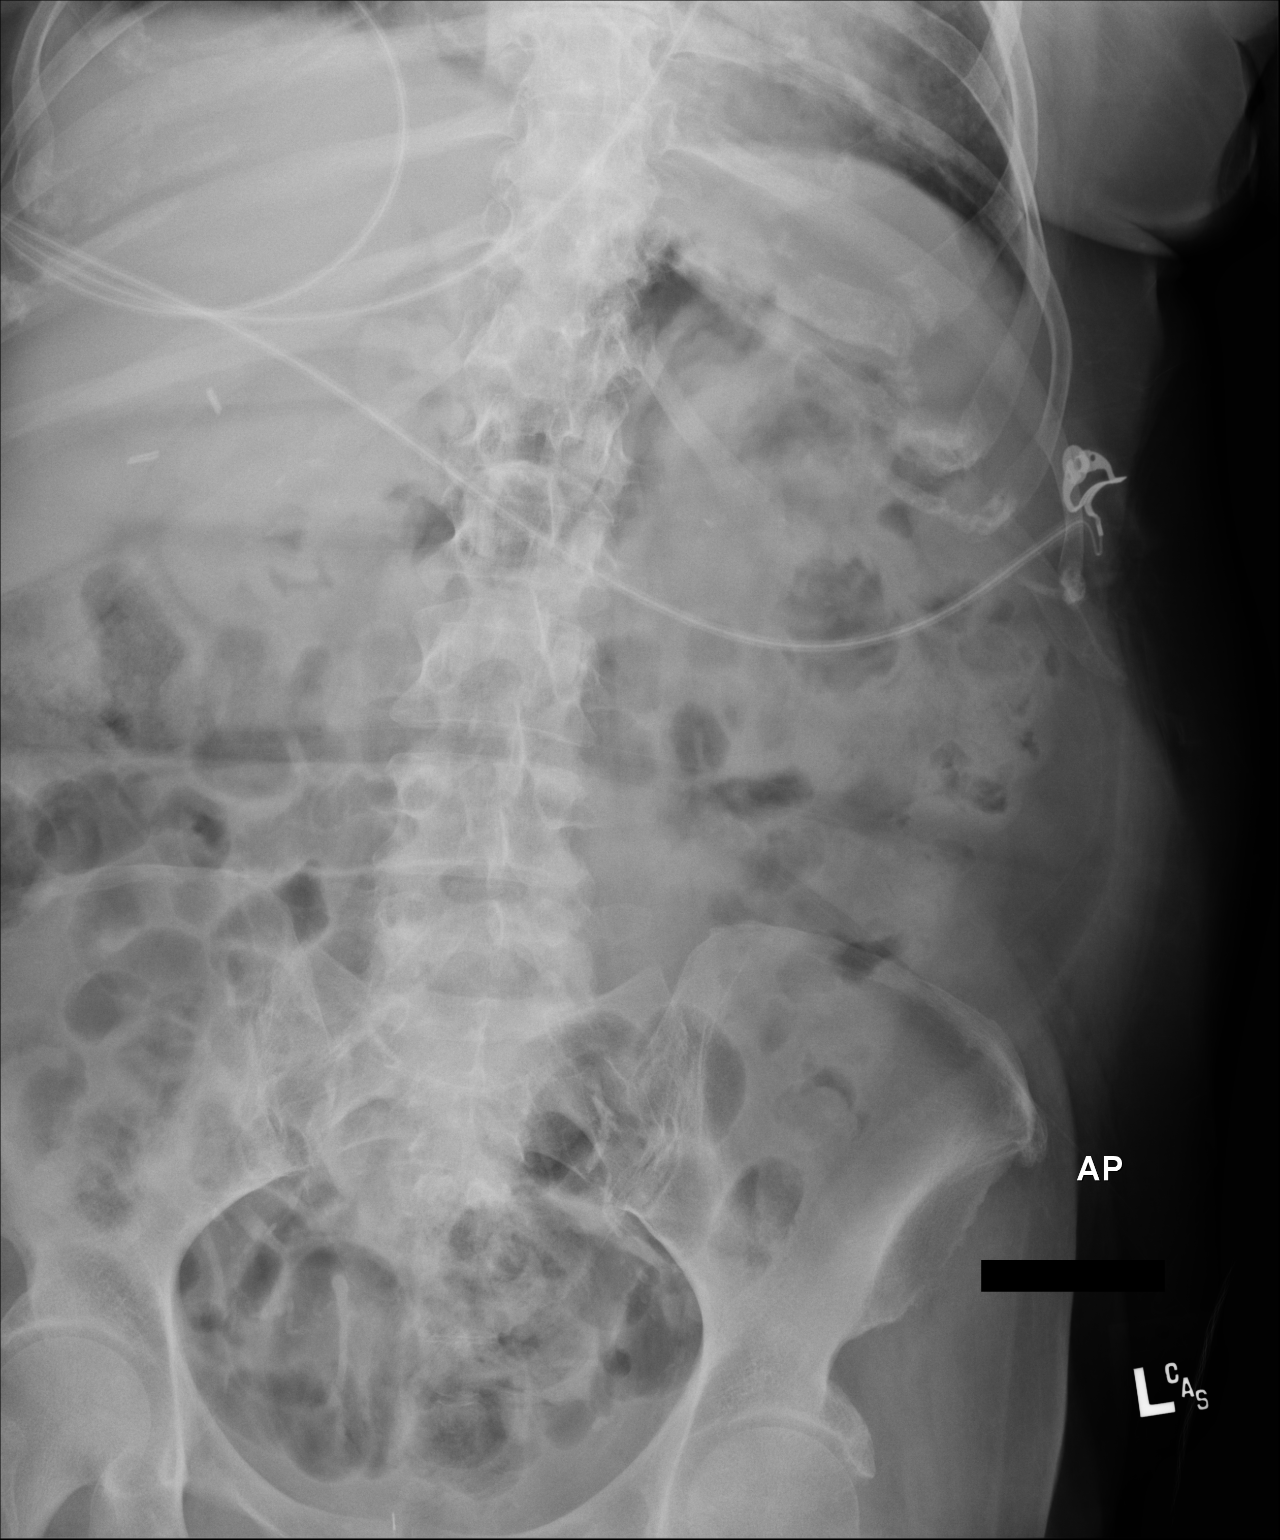

[1 of 1 positions shown; findings below may reference images not displayed]

FINDINGS: Scattered air and stool throughout the bowel. Negative for
obstruction or ileus. Entire right abdomen is not included on the
portable exam. Postop changes in the right upper quadrant.
Degenerative changes of the lower thoracic spine. Aortoiliac
atherosclerosis evident.
IMPRESSION: Negative for obstruction or ileus.

## 2017-06-16 IMAGING — XA IR REMOVAL TUNNELED CV CATH
1 series · 2 of 2 positions shown · non-contrast
Comparison: none

INDICATION: Acute renal failure, resolved. Request removal of tunneled
hemodialysis catheter

[Series 1: fl neuro · 2 of 2 slices shown]
[im 1/2]
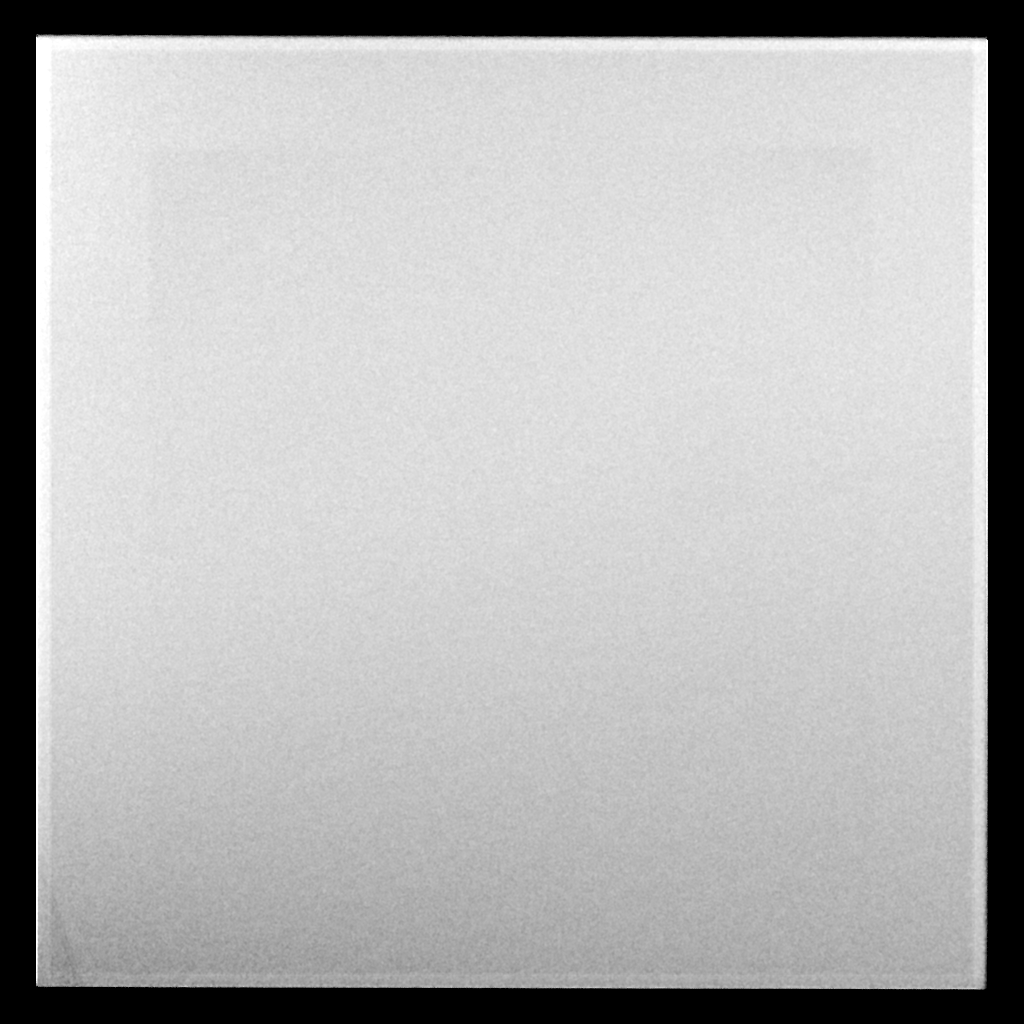
[im 2/2]
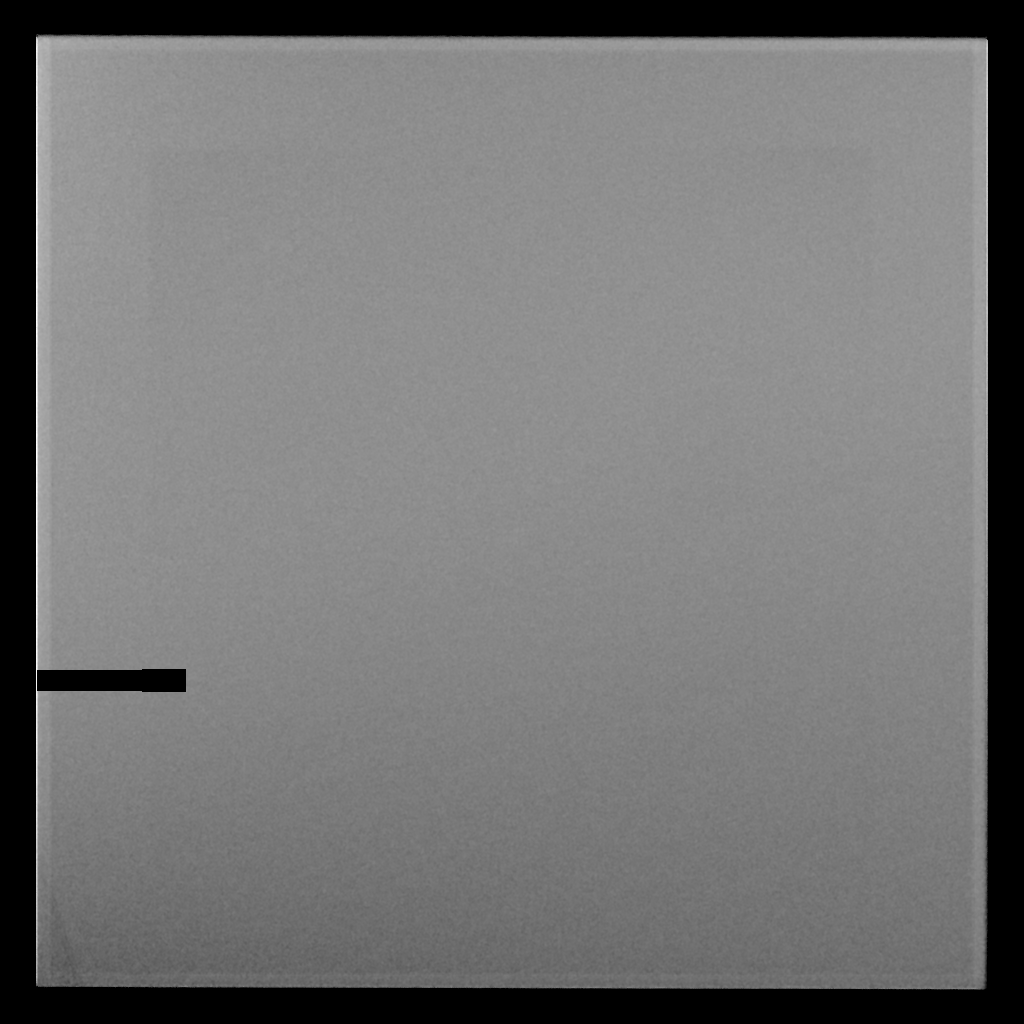

[2 of 2 positions shown; findings below may reference images not displayed]

EXAM:
REMOVAL OF TUNNELED HEMODIALYSIS CATHETER

MEDICATIONS:
None

COMPLICATIONS:
None immediate

PROCEDURE:
Informed written consent was obtained from the patient following an
explanation of the procedure, risks, benefits and alternatives to
treatment. A time out was performed prior to the initiation of the
procedure.

Maximal barrier sterile technique was utilized including caps, mask,
sterile gowns, sterile gloves, large sterile drape, hand hygiene,
and ChloraPrep.

1% lidocaine was injected under sterile conditions along the
subcutaneous tunnel. Utilizing a combination of blunt dissection and
gentle traction, the cuff of the catheter was exposed and the
catheter was removed intact. Hemostasis was obtained with manual
compression. A dressing was placed. The patient tolerated the
procedure well without immediate post procedural complication.
IMPRESSION: Successful removal of tunneled dialysis catheter.
